# Patient Record
Sex: Female | Born: 1975 | Race: Black or African American | Hispanic: No | Marital: Married | Smoking: Never smoker
Health system: Southern US, Community
[De-identification: ages and names within clinical notes are randomized; demographics above are authoritative.]

## PROBLEM LIST (undated history)

## (undated) DIAGNOSIS — M1990 Unspecified osteoarthritis, unspecified site: Secondary | ICD-10-CM

## (undated) DIAGNOSIS — R519 Headache, unspecified: Secondary | ICD-10-CM

## (undated) DIAGNOSIS — M7120 Synovial cyst of popliteal space [Baker], unspecified knee: Secondary | ICD-10-CM

## (undated) DIAGNOSIS — R51 Headache: Secondary | ICD-10-CM

## (undated) DIAGNOSIS — J45909 Unspecified asthma, uncomplicated: Secondary | ICD-10-CM

## (undated) DIAGNOSIS — E669 Obesity, unspecified: Secondary | ICD-10-CM

## (undated) DIAGNOSIS — I10 Essential (primary) hypertension: Secondary | ICD-10-CM

## (undated) DIAGNOSIS — K219 Gastro-esophageal reflux disease without esophagitis: Secondary | ICD-10-CM

## (undated) DIAGNOSIS — T7840XA Allergy, unspecified, initial encounter: Secondary | ICD-10-CM

## (undated) HISTORY — DX: Gastro-esophageal reflux disease without esophagitis: K21.9

## (undated) HISTORY — DX: Headache, unspecified: R51.9

## (undated) HISTORY — DX: Synovial cyst of popliteal space (Baker), unspecified knee: M71.20

## (undated) HISTORY — DX: Headache: R51

## (undated) HISTORY — DX: Essential (primary) hypertension: I10

## (undated) HISTORY — DX: Unspecified osteoarthritis, unspecified site: M19.90

## (undated) HISTORY — DX: Obesity, unspecified: E66.9

## (undated) HISTORY — PX: TONSILLECTOMY: SUR1361

---

## 1998-09-12 ENCOUNTER — Inpatient Hospital Stay (HOSPITAL_COMMUNITY): Admission: AD | Admit: 1998-09-12 | Discharge: 1998-09-15 | Payer: Self-pay | Admitting: Obstetrics and Gynecology

## 1998-09-12 ENCOUNTER — Encounter: Payer: Self-pay | Admitting: Obstetrics and Gynecology

## 1998-09-12 ENCOUNTER — Encounter (INDEPENDENT_AMBULATORY_CARE_PROVIDER_SITE_OTHER): Payer: Self-pay

## 1998-09-16 ENCOUNTER — Encounter (HOSPITAL_COMMUNITY): Admission: RE | Admit: 1998-09-16 | Discharge: 1998-12-15 | Payer: Self-pay | Admitting: Obstetrics and Gynecology

## 1998-10-18 ENCOUNTER — Other Ambulatory Visit: Admission: RE | Admit: 1998-10-18 | Discharge: 1998-10-18 | Payer: Self-pay | Admitting: *Deleted

## 1998-12-17 ENCOUNTER — Encounter (HOSPITAL_COMMUNITY): Admission: RE | Admit: 1998-12-17 | Discharge: 1999-03-17 | Payer: Self-pay | Admitting: Obstetrics and Gynecology

## 1999-11-05 ENCOUNTER — Other Ambulatory Visit: Admission: RE | Admit: 1999-11-05 | Discharge: 1999-11-05 | Payer: Self-pay | Admitting: *Deleted

## 2000-09-29 ENCOUNTER — Encounter: Payer: Self-pay | Admitting: Obstetrics and Gynecology

## 2000-09-29 ENCOUNTER — Observation Stay (HOSPITAL_COMMUNITY): Admission: AD | Admit: 2000-09-29 | Discharge: 2000-09-30 | Payer: Self-pay | Admitting: Obstetrics and Gynecology

## 2000-09-30 ENCOUNTER — Encounter: Payer: Self-pay | Admitting: Obstetrics and Gynecology

## 2000-10-19 ENCOUNTER — Inpatient Hospital Stay (HOSPITAL_COMMUNITY): Admission: AD | Admit: 2000-10-19 | Discharge: 2000-10-22 | Payer: Self-pay | Admitting: Obstetrics and Gynecology

## 2000-10-23 ENCOUNTER — Encounter: Admission: RE | Admit: 2000-10-23 | Discharge: 2000-11-22 | Payer: Self-pay | Admitting: Obstetrics and Gynecology

## 2000-11-17 ENCOUNTER — Other Ambulatory Visit: Admission: RE | Admit: 2000-11-17 | Discharge: 2000-11-17 | Payer: Self-pay | Admitting: Obstetrics and Gynecology

## 2000-11-23 ENCOUNTER — Encounter: Admission: RE | Admit: 2000-11-23 | Discharge: 2000-12-23 | Payer: Self-pay | Admitting: Obstetrics and Gynecology

## 2001-01-23 ENCOUNTER — Encounter: Admission: RE | Admit: 2001-01-23 | Discharge: 2001-02-22 | Payer: Self-pay | Admitting: Obstetrics and Gynecology

## 2002-04-13 ENCOUNTER — Other Ambulatory Visit: Admission: RE | Admit: 2002-04-13 | Discharge: 2002-04-13 | Payer: Self-pay | Admitting: Obstetrics and Gynecology

## 2002-08-03 ENCOUNTER — Encounter: Payer: Self-pay | Admitting: Internal Medicine

## 2002-08-03 ENCOUNTER — Emergency Department (HOSPITAL_COMMUNITY): Admission: EM | Admit: 2002-08-03 | Discharge: 2002-08-03 | Payer: Self-pay | Admitting: Internal Medicine

## 2004-04-22 ENCOUNTER — Ambulatory Visit (HOSPITAL_COMMUNITY): Admission: RE | Admit: 2004-04-22 | Discharge: 2004-04-22 | Payer: Self-pay | Admitting: Nurse Practitioner

## 2004-09-30 ENCOUNTER — Emergency Department (HOSPITAL_COMMUNITY): Admission: EM | Admit: 2004-09-30 | Discharge: 2004-09-30 | Payer: Self-pay | Admitting: Emergency Medicine

## 2005-03-19 ENCOUNTER — Inpatient Hospital Stay (HOSPITAL_COMMUNITY): Admission: RE | Admit: 2005-03-19 | Discharge: 2005-03-22 | Payer: Self-pay | Admitting: Obstetrics and Gynecology

## 2005-03-19 ENCOUNTER — Encounter (INDEPENDENT_AMBULATORY_CARE_PROVIDER_SITE_OTHER): Payer: Self-pay | Admitting: *Deleted

## 2006-11-16 ENCOUNTER — Ambulatory Visit (HOSPITAL_COMMUNITY): Admission: RE | Admit: 2006-11-16 | Discharge: 2006-11-16 | Payer: Self-pay | Admitting: Family Medicine

## 2006-12-31 ENCOUNTER — Ambulatory Visit (HOSPITAL_COMMUNITY): Admission: RE | Admit: 2006-12-31 | Discharge: 2006-12-31 | Payer: Self-pay | Admitting: Family Medicine

## 2007-11-14 ENCOUNTER — Other Ambulatory Visit: Admission: RE | Admit: 2007-11-14 | Discharge: 2007-11-14 | Payer: Self-pay | Admitting: Obstetrics & Gynecology

## 2008-09-12 ENCOUNTER — Observation Stay (HOSPITAL_COMMUNITY): Admission: RE | Admit: 2008-09-12 | Discharge: 2008-09-13 | Payer: Self-pay | Admitting: Otolaryngology

## 2008-09-12 ENCOUNTER — Encounter (INDEPENDENT_AMBULATORY_CARE_PROVIDER_SITE_OTHER): Payer: Self-pay | Admitting: Otolaryngology

## 2009-06-10 ENCOUNTER — Ambulatory Visit (HOSPITAL_COMMUNITY): Admission: RE | Admit: 2009-06-10 | Discharge: 2009-06-10 | Payer: Self-pay | Admitting: Family Medicine

## 2009-11-20 ENCOUNTER — Ambulatory Visit (HOSPITAL_COMMUNITY): Admission: RE | Admit: 2009-11-20 | Discharge: 2009-11-20 | Payer: Self-pay | Admitting: Internal Medicine

## 2010-04-08 ENCOUNTER — Other Ambulatory Visit (HOSPITAL_COMMUNITY)
Admission: RE | Admit: 2010-04-08 | Discharge: 2010-04-08 | Disposition: A | Discharge status: Home / Self Care | Payer: 59 | Source: Ambulatory Visit | Attending: Obstetrics & Gynecology | Admitting: Obstetrics & Gynecology

## 2010-04-08 ENCOUNTER — Other Ambulatory Visit: Payer: Self-pay | Admitting: Obstetrics & Gynecology

## 2010-04-08 DIAGNOSIS — Z01419 Encounter for gynecological examination (general) (routine) without abnormal findings: Secondary | ICD-10-CM | POA: Insufficient documentation

## 2010-06-08 LAB — BASIC METABOLIC PANEL
Calcium: 9.6 mg/dL (ref 8.4–10.5)
Glucose, Bld: 92 mg/dL (ref 70–99)
Potassium: 3.4 mEq/L — ABNORMAL LOW (ref 3.5–5.1)

## 2010-06-08 LAB — CBC
HCT: 41.1 % (ref 36.0–46.0)
Hemoglobin: 13.8 g/dL (ref 12.0–15.0)

## 2010-06-20 ENCOUNTER — Other Ambulatory Visit (HOSPITAL_COMMUNITY): Payer: Self-pay | Admitting: Family Medicine

## 2010-06-20 ENCOUNTER — Ambulatory Visit (HOSPITAL_COMMUNITY)
Admission: RE | Admit: 2010-06-20 | Discharge: 2010-06-20 | Disposition: A | Discharge status: Home / Self Care | Payer: 59 | Source: Ambulatory Visit | Attending: Family Medicine | Admitting: Family Medicine

## 2010-06-20 DIAGNOSIS — M7989 Other specified soft tissue disorders: Secondary | ICD-10-CM | POA: Insufficient documentation

## 2010-06-20 DIAGNOSIS — M79609 Pain in unspecified limb: Secondary | ICD-10-CM | POA: Insufficient documentation

## 2010-06-20 DIAGNOSIS — M109 Gout, unspecified: Secondary | ICD-10-CM

## 2010-06-20 DIAGNOSIS — M25549 Pain in joints of unspecified hand: Secondary | ICD-10-CM

## 2010-07-15 NOTE — Op Note (Signed)
[name]         ACCOUNT NO.:  [id number]   MEDICAL RECORD NO.:  [id number]          PATIENT TYPE:  OBV   LOCATION:  2604                         FACILITY:  [facility]   PHYSICIAN:  Zola Button T. Lazarus Salines, M.D. DATE OF BIRTH:  Nov 19, 1975   DATE OF PROCEDURE:  09/12/2008  DATE OF DISCHARGE:                               OPERATIVE REPORT   PREOPERATIVE DIAGNOSES:  1. Hypertrophic inferior turbinates with nasal obstruction.  2. Chronic tonsillitis.  3. Snoring with borderline sleep apnea.   POSTOPERATIVE DIAGNOSES:  1. Hypertrophic inferior turbinates with nasal obstruction.  2. Chronic tonsillitis.  3. Snoring with borderline sleep apnea.   PROCEDURES PERFORMED:  1. Bilateral submucous resection of inferior turbinates.  2. Adult tonsillectomy.  3. Laser-assisted uvulopalatoplasty.   SURGEON:  Gloris Manchester. Wolicki, MD   ANESTHESIA:  General orotracheal.   BLOOD LOSS:  Minimal.   COMPLICATIONS:  None.   FINDINGS:  Relatively straight midline nasal septum.  Bulky soft tissue  inferior turbinates.  2+ deeply imbedded and fibrotic tonsils with  cryptic debris.  A long soft palate and uvula with a narrow side-to-side  diameter of the oropharynx.  No residual adenoids.   PROCEDURE:  With the patient in a comfortable supine position, general  orotracheal anesthesia was induced without difficulty.  At an  appropriate level, the table was turned 90 degrees away from anesthesia,  and the patient was placed in a slight beach chair position.  A saline-  moistened throat pack was placed.  Nasal vibrissae were trimmed.  She  had received preoperative Afrin spray.  At this point, Afrin solution on  1/2 x 3 inch cottonoids was placed along the low septum and turbinates  on each side.  Xylocaine 1% with 1:100,000 epinephrine, 6 mL total was  infiltrated into the inferior turbinates using a 25-gauge spinal needle.  Several minutes were allowed for this to take effect.   The materials  were removed from the nose and observed to be intact and  correct in number.  The findings were as described above.  No septal  work was required.   Beginning on the right side, the anterior hood of the inferior turbinate  was lysed just behind the nasal valve.  The medial mucosa of the  turbinate was incised in an anterior upsloping fashion.  A laterally  based flap was developed.  The turbinate was infractured.  Using angled  turbinate scissors, the turbinate bone and lateral mucosa was resected  in a posterior downsloping fashion taking virtually all the anterior  pole and leaving virtually all the posterior pole.  Additional bony  spicules were dissected and removed to allow more prompt healing.  The  flap was laid back down and the turbinate was outfractured.  The bulbous  posterior pole and the cut mucosal edges were suction coagulated for  hemostasis.  This completed the right inferior turbinate.  The left side  was done in identical fashion.   Hemostasis was observed.  A quadruple thickness bacitracin-impregnated  Telfa pack was placed low in the nose on each side for hemostasis.  This  completed the nasal procedure.   At  this point, the patient was taken out of a sitting position into a  flat position and placed in a full Trendelenburg.  Using a Weider  retractor and Yankauer suction under direct vision, the throat pack was  removed.  There was minimal blood in the pharynx.   Taking care to protect lips, teeth, and endotracheal tube, the Crowe-  Davis mouth gag was introduced, and a stent for visualization.  She had  a deep narrow mandible and several changes of tube were required to  avoid pinching endotracheal tube.  Finally, a #5 flat blade was felt to  configure to her anatomy nicely, and the mouth gag was suspended from  Mayo stand in the standard fashion.  The findings were as described  above.  Xylocaine 1% with 1:100,000 epinephrine, 6 mL total was  infiltrated in  the peritonsillar planes for intraoperative hemostasis.  Several minutes were allowed for this to take effect.   Beginning on the left side, the tonsil was grasped and retracted  medially.  The mucosa overlying the anterior and superior poles was  coagulated and then cut down to the capsule of the tonsil.  Using the  cautery tip as a blunt dissector, lysing fibrous bands, and coagulating  crossing vessels as identified, the tonsil was dissected from its  muscular fossa from inferiorly upward.  It was moderately fibrotic at  the superior pole.  Tonsil was removed in its entirety as determined by  examination of both tonsil and fossa.  A small additional quantity of  cautery rendered the fossa hemostatic.  After completing the left  tonsillectomy, right was performed in identical fashion.   After completing both tonsillectomies and rendering the tonsil fossae  hemostatic, the soft palate was approached.  The tattoo mark placed in  the office at the palatal dimple point was identified.   The laser had been previously tested for aim and function.  The surgical  field was draped out in saline saturated towels.  All personnel in the  room had protective eye gear.   Using the laser at 20-W continuous setting, angled incisions were made  parallel to the edges of the uvula and up into the soft palate at the  level of the tattoo mark.  A small crescent evidence of tissue from the  superior-posterior pillar of the tonsil was resected contiguous with  this incision on both sides.  Finally, the neo-uvula was grasped in  approximately half way down was amputated leaving a chevron on the  anterior surface and a mucosal flap in the posterior surface.  This  mucosal flap was brought forward and secured with interrupted 3-0 Vicryl  sutures, #3 total.  The posterior tonsillar pillar was distracted  laterally and secured to the anterior tonsillar pillar with interrupted  3-0 Vicryl sutures.  This gave an  improved configuration to the  nasopharyngeal oropharyngeal introitus.  Palate retractor and mirror  were used to visualize the nasopharynx, which was also narrow from side-  to-side, but with no significant residual adenoids.   The mouth gag was removed.  The dental status was intact.  The patient  was returned to Anesthesia, awakened, extubated, and transferred to  recovery in stable condition.   COMMENT:  A 36 year old black female with a history of nasal  obstruction, recurrent tonsillitis, snoring, and subthreshold sleep  apnea with indication for the several components of today's procedure.  Anticipate routine  postoperative recovery with attention to ice, elevation, analgesia,  antibiosis, hydration, and observation for  bleeding, emesis, or airway  compromise.  We will observe 23 hours extended recovery and then remove  the packs in the morning and discharge her to her home in the care of  her family.      Gloris Manchester. Lazarus Salines, M.D.  Electronically Signed     KTW/MEDQ  D:  09/12/2008  T:  09/13/2008  Job:  [id number]   cc:   Corrie Mckusick, M.D.

## 2010-07-18 NOTE — Procedures (Signed)
   NAME:  Erin Cannon, Erin Cannon                   ACCOUNT NO.:  [id number]   MEDICAL RECORD NO.:  [id number]                   PATIENT TYPE:  EMS   LOCATION:  ED                                   FACILITY:  [facility]   PHYSICIAN:  Edward L. Juanetta Gosling, M.D.             DATE OF BIRTH:  [birth year]   DATE OF PROCEDURE:  DATE OF DISCHARGE:  08/03/2002                                EKG INTERPRETATION   EKG INTERPRETATION:  The rhythm is sinus rhythm with a tachycardiac rate of  about 115.  There are diffuse nonspecific ST-T wave abnormalities.  Abnormal  electrocardiogram.                                               Oneal Deputy. Juanetta Gosling, M.D.    ELH/MEDQ  D:  08/03/2002  T:  08/03/2002  Job:  [id number]

## 2010-07-18 NOTE — Discharge Summary (Signed)
[facility]  Patient:    Erin Cannon, Erin Cannon Visit Number: [id number] MRN: [id number]          Service Type: OBS Location: 910A 9132 01 Attending Physician:  Lenoard Aden Dictated by:   Lenoard Aden, M.D. Admit Date:  10/19/2000 Discharge Date: 10/22/2000                             Discharge Summary  HOSPITAL COURSE:              The patient underwent uncomplicated repeat C-section for chronic hypertension on October 19, 2000.  Postoperative course uncomplicated.  Hemoglobin 11.4.  Discharged to home day #3.  Prenatal vitamins, iron, Tylox given.  Hypertensive precautions given.  Follow up in the office in four to six weeks. Dictated by:   Lenoard Aden, M.D. Attending Physician:  Lenoard Aden DD:  11/20/00 TD:  11/20/00 Job: [id number]

## 2010-07-18 NOTE — Discharge Summary (Signed)
[name]         ACCOUNT NO.:  [id number]   MEDICAL RECORD NO.:  [id number]          PATIENT TYPE:  INP   LOCATION:  9103                          FACILITY:  [facility]   PHYSICIAN:  Lenoard Aden, M.D.DATE OF BIRTH:  08-09-1975   DATE OF ADMISSION:  03/19/2005  DATE OF DISCHARGE:  03/22/2005                                 DISCHARGE SUMMARY   Patient underwent uncomplicated repeat cesarean section and tubal ligation  on March 19, 2005.  Tolerated regular diet well.  Discharged home day  three.  Prenatal vitamins, Percocet, and Motrin given.  Follow up in the  office four to six weeks.  Discharge teaching done.      Lenoard Aden, M.D.  Electronically Signed     RJT/MEDQ  D:  04/08/2005  T:  04/08/2005  Job:  [id number]

## 2010-07-18 NOTE — Op Note (Signed)
NAMEMable Erin Cannon         ACCOUNT NO.:  [id number]   MEDICAL RECORD NO.:  [id number]          PATIENT TYPE:  INP   LOCATION:  9103                          FACILITY:  [facility]   PHYSICIAN:  Lenoard Aden, M.D.DATE OF BIRTH:  [birth year]   DATE OF PROCEDURE:  03/19/2005  DATE OF DISCHARGE:                                 OPERATIVE REPORT   PREOPERATIVE DIAGNOSES:  1.  Intrauterine pregnancy at 38 weeks.  2.  Chronic hypertension.  3.  Previous cesarean section, for repeat and tubal ligation.   POSTOPERATIVE DIAGNOSES:  1.  Intrauterine pregnancy at 38 weeks.  2.  Chronic hypertension.  3.  Previous cesarean section, for repeat and tubal ligation.   OPERATION/PROCEDURE:  1.  Repeat low segment transverse cesarean section.  2.  Tubal ligation.   SURGEON:  Lenoard Aden, M.D.   ASSISTANT:  Maxie Better, M.D.   ANESTHESIA:  Spinal by Raul Del, M.D.   ESTIMATED BLOOD LOSS:  1000 mL.   FINDINGS:  Full-term living female, occiput transverse position. Apgars 8 and  9.  Placenta delivered posterior position, intact, three-vessel cord noted.  Uterus and tubes within normal limits.  Specimen to pathology.  Bilateral  tubal segments.   DESCRIPTION OF PROCEDURE:  After being apprised of the risks of anesthesia,  infection, bleeding, injury to abdominal organs and need for repair, delayed  versus immediate complications to include bowel and bladder injury, failure  risk of tubal ligation of 5-10 per 1000, she was  brought to the operating  room.  She was administered spinal anesthetic without complications, prepped  and draped in the usual sterile fashion.  Foley catheter placed.  After  achieving adequate anesthesia, the Pfannenstiel skin incision made  with the  scalpel, carried down to the fascia which is nicked in the midline and  opened transversely using Mayo scissors.  Rectus muscles dissected sharply  in the midline.  Peritoneum entered sharply and  bladder blade placed.  Visceral peritoneum scored in a smile-like fashion, dissected sharply off  the lower uterine segment.  Hysterotomy incision made.  Atraumatic delivery  of full-term living female as noted and handed to the pediatricians in  attendance.  Cord blood collected.  Placenta delivered manually.  Intact  three-vessel cord noted.  Uterus exteriorized, curetted using dry lap pack  and closed with one layer of a 0 Monocryl suture.  The left tube traced out  to the fimbriated end.  Ampullary isthmic portion of the tube is grasped and  avascular portion of the mesosalpinx is cauterized, creating a window.  Proximal and distal plain ties are placed. Tubal segment is excised and  tubal lumens are visualized and cauterized.  The same procedure is done on  the right tube and segment is equally removed and sent to pathology.  Irrigation is accomplished.  Bladder flap and incisions are inspected and  found to be hemostatic.  At this time an omental adhesion is dissected  sharply with the electrocautery off the anterior abdominal wall.  Good  hemostasis is noted.  Fascia then closed using the 0 Monocryl in continuous  runningfashion. The subcutaneous tissues  approximated using 0 plain sutures.  Skin closed using staples.  The patient tolerates the procedure well and is  transferred to the recovery room in good condition.      Lenoard Aden, M.D.  Electronically Signed     RJT/MEDQ  D:  03/19/2005  T:  03/19/2005  Job:  [id number]

## 2010-07-18 NOTE — Op Note (Signed)
[facility]  Patient:    ELVERNA, CAFFEE Visit Number: [id number] MRN: [id number]          Service Type: OBS Location: 910A 9132 01 Attending Physician:  Lenoard Aden Proc. Date: 10/19/00 Adm. Date:  10/19/2000                             Operative Report  PREOPERATIVE DIAGNOSES:       1. A 38 week intrauterine pregnancy.                               2. Chronic hypertension.                               3. Previous cesarean section.  POSTOPERATIVE DIAGNOSES:      1. A 38 week intrauterine pregnancy.                               2. Chronic hypertension.                               3. Previous cesarean section.                               4. Pelvic adhesions.  OPERATION:  SURGEON:                      Lenoard Aden, M.D.  ASSISTANT:                    Pershing Cox, M.D.  ANESTHESIA:                   Spinal by Gretta Cool., M.D.  ESTIMATED BLOOD LOSS:         1000 cc.  COMPLICATIONS:                None.  COUNTS:                       Correct.  FINDINGS:                     Full-term living female, 4 pounds and 14 ounces. Apgars 8 and 9.  Placenta manually intact anterior, three vessel cord noted. The patient to recovery in good condition.  Normal tubes and normal ovaries noted.  All counts correct.  DESCRIPTION OF PROCEDURE:     After being apprised of the risks of anesthesia, infection, bleeding, injury to abdominal organs, and need for repair, the patient was brought to the operating room where she was administered spinal anesthetic without complications, prepped and draped in the usual sterile fashion, and catheterized with a Foley catheter.  After achieving adequate anesthesia and a dilute Marcaine solution placed in the area of the old incision, a Pfannenstiel skin incision was made with the scalpel.  After achieving adequate anesthesia, carried down to the fascia which was nicked in the  midline and opened transversely using Mayo scissors.  The rectus muscles are sharply adherent to the anterior bladder flap which was dissected sharply using Mayo scissors.  The rectus muscles  are dissected down sharply in the midline.  The bladder flap is adherent up to the mid body of the uterus.  This was dissected sharply off the lower uterine segment, exposing the previous low segment transverse incision.  A ________ hysterotomy incision had been made and clear fluid noted.  Delivery of a full-term living female from occiput anterior position and handed to pediatricians in attendance.  Apgars 8 and 9. Placenta anterior and delivered manually intact and three vessel cord noted. The uterus exteriorized and normal tubes and ovaries noted.  Uterine curet and using a dry lap pack, attempts to dilate the cervix were unsuccessful using sponge forceps.  At this time, uterine incision is closed using a 0 Monocryl in a continuous running fashion.  A second layer of 0 Monocryl was placed to imbricate the first incision.  Pericolic gutters irrigated and _________ subsequently removed.  The fascia then closed with 0 Vicryl in a continuous running fashion and the skin closed using staples.  The patient tolerated the procedure well and is transferred to the recovery room in good condition. Attending Physician:  Lenoard Aden DD:  10/19/00 TD:  10/20/00 Job: [id number]

## 2010-07-18 NOTE — H&P (Signed)
[facility]  Patient:    Erin Cannon, Erin Cannon                  MRN: [id number] Adm. Date:  [id number] Attending:  Esmeralda Arthur CC:         [facility]   History and Physical  CHIEF COMPLAINT:              Nonreassuring fetal heart rate status.  HISTORY OF PRESENT ILLNESS:   Patient is a 35 year old African-American female, G2, P72, EDD September 2, at 35-2/7ths weeks gestation with a biophysical profile of 2/8 in the office today, repeat biophysical profile 6/10 in [facility].  Patient has a history of borderline gestational hypertension versus chronic hypertension on no medications.  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  PAST MEDICAL HISTORY:         No known drug allergies.  Medications are prenatal vitamins and baby aspirin which was discontinued today.  History of C section at 29 weeks of 1 pound, 12 ounce female in July 2000.  History of migraine headaches.  No other medical or surgical hospitalizations.  PRENATAL LABORATORY DATA:      Blood type O positive, Rh antibody negative, rubella immune, hepatitis B surface and HIV nonreactive.  Pregnancy course uncomplicated except for elevated blood pressures which have not required therapy ranging 120 to 140/80 to 92.  Patient has a history of sickle cell trait.  Father of the baby has tested negative.  PHYSICAL EXAMINATION:  GENERAL:                      She is a well-developed, well-nourished African-American female in no apparent distress.  HEENT:                        Normal.  LUNGS:                        Clear.  HEART:                        Regular rhythm.  ABDOMEN:                      Soft, gravid, and nontender.  PELVIC:                       Cervix closed, 3 cm long, vertex down to the pelvis.  Estimated fetal  weight on ultrasound today 4 pounds and 15 ounces, and normal amniotic fluid index.  Biophysical profile at [facility] 6/10 with normal umbilical artery and MCA Dopplers.  IMPRESSION:                   1. A 35-2/7ths week intrauterine pregnancy.                               2. Chronic hypertension versus gestational                                  hypertension, stable, on no medical therapy.  3. Small for gestational age with estimated                                  fetal weight in the 12th percentile and                                  biophysical profile of 6/10 today.  PLAN:                         Continuous monitoring this evening.  NST is reactive without evidence of decelerations at this time.  Will repeat biophysical profile in the morning, discontinue baby aspirin, discharge home for twice weekly surveillance if biophysical profile 8/10 or better in the morning. DD:  09/29/00 TD:  09/29/00 Job: [id number]

## 2010-07-18 NOTE — H&P (Signed)
[facility]  Patient:    Erin Cannon, Erin Cannon Visit Number: [id number] MRN: [id number]          Service Type: OBS Location: 910A 9132 01 Attending Physician:  Lenoard Aden Adm. Date:  10/19/2000                           History and Physical  CHIEF COMPLAINT:              Elective repeat cesarean section.  HISTORY OF PRESENT ILLNESS:   The patient is a 35 year old, African-American female, G2, P46, EDD November 01, 2000, at 38 weeks for repeat C-section due to chronic hypertension, currently on no medications.  History of severe second trimester preeclampsia with HELLP syndrome and first pregnancy delivered by C-section.  The desires elective repeat.  ALLERGIES:                    No known drug allergies.  MEDICATIONS:                  Prenatal vitamins.  PAST MEDICAL HISTORY:         History of previous C-section, 29 weeks, 1 pound 12 ounce female in July of 2000.  History of migraine headaches.  No other medical or surgical hospitalizations.  SOCIAL HISTORY:               Noncontributory.  FAMILY HISTORY:               Noncontributory.  PRENATAL LABORATORY DATA:     Blood type O positive.  Rh antibody negative. Rubella-immune.  Hepatitis B surface antigen negative.  HIV nonreactive.  GBS negative.  History of sickle cell trait with father of the baby testing negative.  PHYSICAL EXAMINATION:  GENERAL:                      On physical examination, she is an obese, black female, in no apparent distress.  HEENT:                        Normal.  LUNGS:                        Clear.  HEART:                        Regular rate and rhythm.  ABDOMEN:                      Soft, gravida, obese, and nontender.  Estimated fetal weight 6 pounds.  PELVIC:                       Cervical is closed, baby vertex and floating.  EXTREMITIES:                  Reveal no cords.  NEUROLOGICAL:                 Examination is nonfocal.  IMPRESSION:                    1. Chronic hypertension at 38 weeks.                               2. Previous cesarean section for  elective                                  repeat.  PLAN:                         Proceed with elective repeat low-segment transverse cesarean section.  Risks of anesthesia, infection, bleeding, injury to abdominal organs with need for repair is discussed.  The patient acknowledges and desires to proceed. Attending Physician:  Lenoard Aden DD:  10/19/00 TD:  10/19/00 Job: [id number]

## 2011-04-14 ENCOUNTER — Other Ambulatory Visit (HOSPITAL_COMMUNITY)
Admission: RE | Admit: 2011-04-14 | Discharge: 2011-04-14 | Disposition: A | Discharge status: Home / Self Care | Payer: BC Managed Care – PPO | Source: Ambulatory Visit | Attending: Obstetrics & Gynecology | Admitting: Obstetrics & Gynecology

## 2011-04-14 ENCOUNTER — Other Ambulatory Visit: Payer: Self-pay | Admitting: Obstetrics & Gynecology

## 2011-04-14 DIAGNOSIS — Z01419 Encounter for gynecological examination (general) (routine) without abnormal findings: Secondary | ICD-10-CM | POA: Insufficient documentation

## 2012-07-04 ENCOUNTER — Encounter: Payer: Self-pay | Admitting: *Deleted

## 2012-07-04 DIAGNOSIS — I10 Essential (primary) hypertension: Secondary | ICD-10-CM | POA: Insufficient documentation

## 2012-07-04 DIAGNOSIS — K219 Gastro-esophageal reflux disease without esophagitis: Secondary | ICD-10-CM | POA: Insufficient documentation

## 2012-07-05 ENCOUNTER — Other Ambulatory Visit: Payer: Self-pay | Admitting: Obstetrics & Gynecology

## 2012-07-18 ENCOUNTER — Ambulatory Visit (INDEPENDENT_AMBULATORY_CARE_PROVIDER_SITE_OTHER): Payer: BC Managed Care – PPO | Admitting: Obstetrics & Gynecology

## 2012-07-18 ENCOUNTER — Encounter: Payer: Self-pay | Admitting: Obstetrics & Gynecology

## 2012-07-18 VITALS — BP 130/100 | Ht 67.0 in | Wt 291.0 lb

## 2012-07-18 DIAGNOSIS — Z01419 Encounter for gynecological examination (general) (routine) without abnormal findings: Secondary | ICD-10-CM

## 2012-07-18 NOTE — Progress Notes (Signed)
Patient ID: Erin Cannon, female   DOB: [birth year], 37 y.o.   MRN: [id number] Subjective:     Erin Cannon is a 37 y.o. female here for a routine exam.  Patient's last menstrual period was 07/05/2012. G3P3 Current complaints: none.  Personal health questionnaire reviewed: yes.   Gynecologic History Patient's last menstrual period was 07/05/2012. Contraception: tubal ligation Last Pap: 2012. Results were: normal Last mammogram: na. Results were: na  Obstetric History OB History   Grav Para Term Preterm Abortions TAB SAB Ect Mult Living   3 3             # Outc Date GA Lbr Len/2nd Wgt Sex Del Anes PTL Lv   1 PAR 2000   1lb(0.454kg) M CS      2 PAR 2002   5lb(2.268kg) F CS      3 PAR 2007   8lb(3.629kg) M CS          The following portions of the patient's history were reviewed and updated as appropriate: allergies, current medications, past family history, past medical history, past social history, past surgical history and problem list.  Review of Systems  Review of Systems  Constitutional: Negative for fever, chills, weight loss, malaise/fatigue and diaphoresis.  HENT: Negative for hearing loss, ear pain, nosebleeds, congestion, sore throat, neck pain, tinnitus and ear discharge.   Eyes: Negative for blurred vision, double vision, photophobia, pain, discharge and redness.  Respiratory: Negative for cough, hemoptysis, sputum production, shortness of breath, wheezing and stridor.   Cardiovascular: Negative for chest pain, palpitations, orthopnea, claudication, leg swelling and PND.  Gastrointestinal: negative for abdominal pain. Negative for heartburn, nausea, vomiting, diarrhea, constipation, blood in stool and melena.  Genitourinary: Negative for dysuria, urgency, frequency, hematuria and flank pain.  Musculoskeletal: Negative for myalgias, back pain, joint pain and falls.  Skin: Negative for itching and rash.  Neurological: Negative for dizziness, tingling,  tremors, sensory change, speech change, focal weakness, seizures, loss of consciousness, weakness and headaches.  Endo/Heme/Allergies: Negative for environmental allergies and polydipsia. Does not bruise/bleed easily.  Psychiatric/Behavioral: Negative for depression, suicidal ideas, hallucinations, memory loss and substance abuse. The patient is not nervous/anxious and does not have insomnia.        Objective:    Physical Exam  Vitals reviewed. Constitutional: She is oriented to person, place, and time. She appears well-developed and well-nourished.  HENT:  Head: Normocephalic and atraumatic.        Right Ear: External ear normal.  Left Ear: External ear normal.  Nose: Nose normal.  Mouth/Throat: Oropharynx is clear and moist.  Eyes: Conjunctivae and EOM are normal. Pupils are equal, round, and reactive to light. Right eye exhibits no discharge. Left eye exhibits no discharge. No scleral icterus.  Neck: Normal range of motion. Neck supple. No tracheal deviation present. No thyromegaly present.  Cardiovascular: Normal rate, regular rhythm, normal heart sounds and intact distal pulses.  Exam reveals no gallop and no friction rub.   No murmur heard. Respiratory: Effort normal and breath sounds normal. No respiratory distress. She has no wheezes. She has no rales. She exhibits no tenderness.  GI: Soft. Bowel sounds are normal. She exhibits no distension and no mass. There is no tenderness. There is no rebound and no guarding.  Genitourinary:       Vulva is normal without lesions Vagina is pink moist without discharge Cervix normal in appearance and pap is done Uterus is normal size shape and contour Adnexa is negative with  normal sized ovaries   Musculoskeletal: Normal range of motion. She exhibits no edema and no tenderness.  Neurological: She is alert and oriented to person, place, and time. She has normal reflexes. She displays normal reflexes. No cranial nerve deficit. She exhibits  normal muscle tone. Coordination normal.  Skin: Skin is warm and dry. No rash noted. No erythema. No pallor.  Psychiatric: She has a normal mood and affect. Her behavior is normal. Judgment and thought content normal.       Assessment:    Healthy female exam.    Plan:    Contraception: tubal ligation. Follow up in: prn time.   or 1 year

## 2014-01-01 ENCOUNTER — Encounter: Payer: Self-pay | Admitting: Obstetrics & Gynecology

## 2014-07-24 ENCOUNTER — Other Ambulatory Visit (HOSPITAL_COMMUNITY): Payer: Self-pay | Admitting: Internal Medicine

## 2014-07-24 DIAGNOSIS — Z1231 Encounter for screening mammogram for malignant neoplasm of breast: Secondary | ICD-10-CM

## 2014-08-15 ENCOUNTER — Ambulatory Visit (HOSPITAL_COMMUNITY): Payer: Medicaid Other

## 2014-11-22 ENCOUNTER — Telehealth: Payer: Self-pay | Admitting: Neurology

## 2014-11-22 NOTE — Telephone Encounter (Signed)
FYI:  I called the patient and left message that she needs to contact the office that referred her to make any referral changes.

## 2014-11-22 NOTE — Telephone Encounter (Signed)
Patient called said she lives in [place] and if she is a candidate for sleep study can she be referred somewhere closer as opposed to coming back to sleep ctr in office. She also is inquiring if she does have sleep study here, would someone be able to stay with her from her family. Please call and advise at [phone].

## 2014-12-12 ENCOUNTER — Encounter: Payer: Self-pay | Admitting: Neurology

## 2014-12-12 ENCOUNTER — Ambulatory Visit (INDEPENDENT_AMBULATORY_CARE_PROVIDER_SITE_OTHER): Payer: BLUE CROSS/BLUE SHIELD | Admitting: Neurology

## 2014-12-12 VITALS — BP 124/78 | HR 98 | Resp 16 | Ht 67.0 in | Wt 305.0 lb

## 2014-12-12 DIAGNOSIS — G4761 Periodic limb movement disorder: Secondary | ICD-10-CM | POA: Diagnosis not present

## 2014-12-12 DIAGNOSIS — G4719 Other hypersomnia: Secondary | ICD-10-CM | POA: Diagnosis not present

## 2014-12-12 DIAGNOSIS — E669 Obesity, unspecified: Secondary | ICD-10-CM

## 2014-12-12 DIAGNOSIS — R351 Nocturia: Secondary | ICD-10-CM | POA: Diagnosis not present

## 2014-12-12 DIAGNOSIS — G2581 Restless legs syndrome: Secondary | ICD-10-CM

## 2014-12-12 DIAGNOSIS — G4733 Obstructive sleep apnea (adult) (pediatric): Secondary | ICD-10-CM

## 2014-12-12 DIAGNOSIS — R51 Headache: Secondary | ICD-10-CM | POA: Diagnosis not present

## 2014-12-12 DIAGNOSIS — R519 Headache, unspecified: Secondary | ICD-10-CM

## 2014-12-12 NOTE — Progress Notes (Signed)
Subjective:    Patient ID: Erin Cannon is a 39 y.o. female.  HPI     [name], MD, PhD [facility] [address], [address] [address] [place], [place] [address]  Dear Dr. Phillips Odor,  I saw your patient, Erin Cannon, upon your kind request in my neurologic clinic today for initial consultation of her sleep disorder, in particular, concern for underlying obstructive sleep apnea. The patient is accompanied by her 58 yo son today. As you know, Erin Cannon is a very pleasant 39 year old right-handed woman with an underlying medical history of hypertension, reflux disease, knee pain, and severe obesity, who reports snoring and excessive daytime somnolence. I reviewed your office note from 10/29/2014, which you kindly included. She has been on Norco as needed for knee pain and you have referred her to orthopedics. She states that she  is scheduled for an MRI of her left knee. She also is going to see rheumatology as I understand. She reports loud snoring, waking up with a sense of gasping, witnessed pauses in her breathing and waking up with a headache, usually once or twice per week. She has to get up to use the bathroom multiple times in night, about 4-5 times on an average night. She also endorses restless leg symptoms and leg twitching in her sleep as well as leg cramps at times. Symptoms have been ongoing for months. Her weight has been fluctuating but overall she has not gained a lot or lost a lot of weight. She has struggled with her obesity for a long time. She lives with her 3 children, ages 50, 54 and 71. 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 She does not smoke or drink alcohol and drinks caffeine in the form of coffee usually once per day. She had her tonsils removed and uvula trimmed a few years ago under Dr. Lazarus Salines.  She reports no family history of obstructive sleep apnea. Her Epworth sleepiness score is 17 out of 24 today, her  fatigue score is 41 out of 63.  Her Past Medical History Is Significant For: Past Medical History  Diagnosis Date  . Hypertension   . Acid reflux   . Baker's cyst   . Obesity   . Headache   . Osteoarthritis     Her Past Surgical History Is Significant For: Past Surgical History  Procedure Laterality Date  . Cesarean section    . Tonsillectomy      Her Family History Is Significant For: Family History  Problem Relation Age of Onset  . Cancer Mother     breast- age 18  . Heart disease Other   . Hypertension Other   . Heart attack Father     Her Social History Is Significant For: Social History   Social History  . Marital Status: Divorced    Spouse Name: N/A  . Number of Children: 3  . Years of Education: BS   Occupational History  . [employer]     Social History Main Topics  . Smoking status: Never Smoker   . Smokeless tobacco: None  . Alcohol Use: No  . Drug Use: No  . Sexual Activity: No   Other Topics Concern  . None   Social History Narrative   Drinks about 1 cup of coffee or tea a day     Her Allergies Are:  No Known Allergies:   Her Current Medications Are:  Outpatient Encounter Prescriptions as of 12/12/2014  Medication Sig  .  cholecalciferol (VITAMIN D) 1000 UNITS tablet Take 1,000 Units by mouth daily.  Marland Kitchen etodolac (LODINE) 400 MG tablet   . furosemide (LASIX) 20 MG tablet Take 20 mg by mouth.  Marland Kitchen HYDROcodone-acetaminophen (NORCO/VICODIN) 5-325 MG tablet Take 1 tablet by mouth every 6 (six) hours as needed for moderate pain.  Marland Kitchen losartan (COZAAR) 50 MG tablet Take 50 mg by mouth daily.  Marland Kitchen omeprazole (PRILOSEC) 20 MG capsule Take 20 mg by mouth daily.  . [DISCONTINUED] traMADol (ULTRAM) 50 MG tablet Take by mouth every 6 (six) hours as needed.   No facility-administered encounter medications on file as of 12/12/2014.  :  Review of Systems:  Out of a complete 14 point review of systems, all are reviewed and negative with the  exception of these symptoms as listed below:   Review of Systems  Constitutional: Positive for fatigue.  Cardiovascular: Positive for leg swelling.  Endocrine: Positive for polydipsia.       Feeling cold   Musculoskeletal:       Joint pain and swelling, cramps, aching muscles   Neurological: Positive for weakness and numbness.       No trouble falling asleep, trouble staying asleep, snoring, witnessed apnea, wakes up feeling tired in the morning, morning headaches, daytime tiredness, denies taking naps. Restless legs.   Psychiatric/Behavioral:       Anxiety, not enough sleep, decreased energy     Objective:  Neurologic Exam  Physical Exam Physical Examination:   Filed Vitals:   12/12/14 1332  BP: 124/78  Pulse: 98  Resp: 16   General Examination: The patient is a very pleasant 39 y.o. female in no acute distress. She appears well-developed and well-nourished and well groomed.   HEENT: Normocephalic, atraumatic, pupils are equal, round and reactive to light and accommodation. Funduscopic exam is normal with sharp disc margins noted. Extraocular tracking is good without limitation to gaze excursion or nystagmus noted. Normal smooth pursuit is noted. Hearing is grossly intact. Tympanic membranes are clear bilaterally. Face is symmetric with normal facial animation and normal facial sensation. Speech is clear with no dysarthria noted. There is no hypophonia. There is no lip, neck/head, jaw or voice tremor. Neck is supple with full range of passive and active motion. There are no carotid bruits on auscultation. Oropharynx exam reveals: mild mouth dryness, good dental hygiene and moderate airway crowding, due to narrow airway entry, thicker soft palate, and longer tongue. Tonsils are absent with possible residual tonsillar tissue noted and uvula is trimmed. Mallampati is class II. Tongue protrudes centrally and palate elevates symmetrically. Neck circumference is 16-3/8 inches.Nasal inspection  reveals no significant nasal mucosal bogginess or redness and no septal deviation.   Chest: Clear to auscultation without wheezing, rhonchi or crackles noted.  Heart: S1+S2+0, regular and normal without murmurs, rubs or gallops noted.   Abdomen: Soft, non-tender and non-distended with normal bowel sounds appreciated on auscultation.  Extremities: There is trace pitting edema in the distal lower extremities bilaterally. Pedal pulses are intact.  Skin: Warm and dry without trophic changes noted. There are no varicose veins.  Musculoskeletal: exam reveals no obvious joint deformities, tenderness or joint swelling or erythema, with the exception of low back pain reported and left knee pain.   Neurologically:  Mental status: The patient is awake, alert and oriented in all 4 spheres. Her immediate and remote memory, attention, language skills and fund of knowledge are appropriate. There is no evidence of aphasia, agnosia, apraxia or anomia. Speech is clear with normal  prosody and enunciation. Thought process is linear. Mood is normal and affect is normal.  Cranial nerves II - XII are as described above under HEENT exam. In addition: shoulder shrug is normal with equal shoulder height noted. Motor exam: Normal bulk, strength and tone is noted. There is no drift, tremor or rebound. Romberg is negative. Reflexes are 2+ throughout. Babinski: Toes are flexor bilaterally. Fine motor skills and coordination: intact with normal finger taps, normal hand movements, normal rapid alternating patting, normal foot taps and normal foot agility.  Cerebellar testing: No dysmetria or intention tremor on finger to nose testing. Heel to shin is unremarkable bilaterally. There is no truncal or gait ataxia.  Sensory exam: intact to light touch, pinprick, vibration, temperature sense in the upper and lower extremities.  Gait, station and balance: She stands with difficulty. No veering to one side is noted. No leaning to one  side is noted. Posture is age-appropriate and stance is narrow based. Gait shows is mildly slow and cautious. She has trouble with tandem walk as she has trouble putting weight on her left leg.               Assessment and Plan:  In summary, Erin Cannon is a very pleasant 39 y.o.-year old female with an underlying medical history of hypertension, reflux disease, knee pain, and severe obesity, whose history and physical exam are in keeping with obstructive sleep apnea (OSA). I had a long chat with the patient about my findings and the diagnosis of OSA its prognosis and treatment options. We talked about medical treatments, surgical interventions and non-pharmacological approaches. I explained in particular the risks and ramifications of untreated moderate to severe OSA, especially with respect to developing cardiovascular disease down the Road, including congestive heart failure, difficult to treat hypertension, cardiac arrhythmias, or stroke. Even type 2 diabetes has, in part, been linked to untreated OSA. Symptoms of untreated OSA include daytime sleepiness, memory problems, mood irritability and mood disorder such as depression and anxiety, lack of energy, as well as recurrent headaches, especially morning headaches. We talked about trying to maintain a healthy lifestyle in general, as well as the importance of weight control. I encouraged the patient to eat healthy, exercise daily and keep well hydrated, to keep a scheduled bedtime and wake time routine, to not skip any meals and eat healthy snacks in between meals. I advised the patient not to drive when feeling sleepy. I recommended the following at this time: sleep study with potential positive airway pressure titration. (We will score hypopneas at 3% and split the sleep study into diagnostic and treatment portion, if the estimated. 2 hour AHI is >15/h).   I explained the sleep test procedure to the patient and also outlined possible surgical  and non-surgical treatment options of OSA, including the use of a custom-made dental device (which would require a referral to a specialist dentist or oral surgeon), upper airway surgical options, such as pillar implants, radiofrequency surgery, tongue base surgery, and UPPP (which would involve a referral to an ENT surgeon). Rarely, jaw surgery such as mandibular advancement may be considered.  I also explained the CPAP treatment option to the patient, who indicated that she would be willing to try CPAP if the need arises. I explained the importance of being compliant with PAP treatment, not only for insurance purposes but primarily to improve Her symptoms, and for the patient's long term health benefit, including to reduce Her cardiovascular risks. I answered all her questions today  and the patient was in agreement. I would like to see her back after the sleep study is completed and encouraged her to call with any interim questions, concerns, problems or updates.   Thank you very much for allowing me to participate in the care of this nice patient. If I can be of any further assistance to you please do not hesitate to call me at [phone].  Sincerely,   Star Age, MD, PhD

## 2014-12-12 NOTE — Patient Instructions (Signed)

## 2014-12-20 ENCOUNTER — Ambulatory Visit (INDEPENDENT_AMBULATORY_CARE_PROVIDER_SITE_OTHER): Payer: BLUE CROSS/BLUE SHIELD | Admitting: Neurology

## 2014-12-20 DIAGNOSIS — G479 Sleep disorder, unspecified: Secondary | ICD-10-CM

## 2014-12-20 DIAGNOSIS — G4733 Obstructive sleep apnea (adult) (pediatric): Secondary | ICD-10-CM | POA: Diagnosis not present

## 2014-12-20 DIAGNOSIS — G4761 Periodic limb movement disorder: Secondary | ICD-10-CM

## 2014-12-20 DIAGNOSIS — G4710 Hypersomnia, unspecified: Secondary | ICD-10-CM

## 2014-12-20 DIAGNOSIS — G4720 Circadian rhythm sleep disorder, unspecified type: Secondary | ICD-10-CM

## 2014-12-20 DIAGNOSIS — R0683 Snoring: Secondary | ICD-10-CM

## 2014-12-21 NOTE — Sleep Study (Signed)
Please see the scanned sleep study interpretation located in the procedure tab within the chart review section.   

## 2014-12-26 ENCOUNTER — Telehealth: Payer: Self-pay | Admitting: Neurology

## 2014-12-26 NOTE — Telephone Encounter (Signed)
Patient referred by Dr. Phillips OdorGolding, seen by me on 12/12/14, diagnostic PSG on 12/20/14.   Please call and notify the patient that the recent sleep study did not show any significant obstructive sleep apnea. She had very mild leg twitching in sleep, but no significant sleep disorder explaining her sleepiness. She can FU with PCP. I recommend pursuing good sleep hygiene: Keep a regular sleep and wake schedule, try not to exercise or have a meal within 2 hours of your bedtime, try to keep your bedroom conducive for sleep, that is, cool and dark, without light distractors such as an illuminated alarm clock, and refrain from watching TV right before sleep or in the middle of the night and do not keep the TV or radio on during the night. Also, try not to use or play on electronic devices at bedtime, such as your cell phone, tablet PC or laptop. If you like to read at bedtime on an electronic device, try to dim the background light as much as possible. Do not eat in the middle of the night.   I can see her as needed or we can make a FU appointment to go over the details of the study if she desires.   Also, route or fax report to PCP and referring MD, if other than PCP.  Once you have spoken to patient, you can close this encounter.   Thanks,  Huston FoleySaima Gisell Buehrle, MD, PhD [facility]

## 2014-12-26 NOTE — Telephone Encounter (Signed)
I spoke to patient and she is aware of results and recommendation. She will f/u with PCP. I will fax report to him

## 2016-07-03 LAB — BASIC METABOLIC PANEL: GLUCOSE: 90 mg/dL

## 2017-05-24 ENCOUNTER — Encounter (HOSPITAL_COMMUNITY): Payer: Self-pay | Admitting: Emergency Medicine

## 2017-05-24 ENCOUNTER — Emergency Department (HOSPITAL_COMMUNITY)
Admission: EM | Admit: 2017-05-24 | Discharge: 2017-05-24 | Disposition: A | Discharge status: Home / Self Care | Payer: Self-pay | Attending: Emergency Medicine | Admitting: Emergency Medicine

## 2017-05-24 ENCOUNTER — Other Ambulatory Visit: Payer: Self-pay

## 2017-05-24 DIAGNOSIS — Z79899 Other long term (current) drug therapy: Secondary | ICD-10-CM | POA: Insufficient documentation

## 2017-05-24 DIAGNOSIS — N939 Abnormal uterine and vaginal bleeding, unspecified: Secondary | ICD-10-CM

## 2017-05-24 DIAGNOSIS — I10 Essential (primary) hypertension: Secondary | ICD-10-CM | POA: Insufficient documentation

## 2017-05-24 DIAGNOSIS — N938 Other specified abnormal uterine and vaginal bleeding: Secondary | ICD-10-CM | POA: Insufficient documentation

## 2017-05-24 LAB — CBC WITH DIFFERENTIAL/PLATELET
BASOS PCT: 0 %
Basophils Absolute: 0 10*3/uL (ref 0.0–0.1)
EOS ABS: 0.1 10*3/uL (ref 0.0–0.7)
Eosinophils Relative: 2 %
HCT: 38.8 % (ref 36.0–46.0)
Hemoglobin: 12.7 g/dL (ref 12.0–15.0)
Lymphocytes Relative: 36 %
Lymphs Abs: 1.8 10*3/uL (ref 0.7–4.0)
MCH: 27.3 pg (ref 26.0–34.0)
MCHC: 32.7 g/dL (ref 30.0–36.0)
MCV: 83.3 fL (ref 78.0–100.0)
MONO ABS: 0.4 10*3/uL (ref 0.1–1.0)
MONOS PCT: 7 %
Neutro Abs: 2.7 10*3/uL (ref 1.7–7.7)
Neutrophils Relative %: 55 %
PLATELETS: 292 10*3/uL (ref 150–400)
RBC: 4.66 MIL/uL (ref 3.87–5.11)
RDW: 13.7 % (ref 11.5–15.5)
WBC: 5 10*3/uL (ref 4.0–10.5)

## 2017-05-24 LAB — I-STAT BETA HCG BLOOD, ED (MC, WL, AP ONLY)

## 2017-05-24 MED ORDER — MEDROXYPROGESTERONE ACETATE 10 MG PO TABS: ORAL_TABLET | ORAL | 0 refills | Status: DC

## 2017-05-24 NOTE — ED Triage Notes (Signed)
Pt to ER for 3 day hx of heavy menstrual bleeding, reports going through 5 pads/hr, reports dizziness and weakness as well. Pt VSS. Pt is a/o x4.

## 2017-05-24 NOTE — ED Notes (Signed)
Pelvic cart set up at bedside  

## 2017-05-24 NOTE — ED Provider Notes (Signed)
[facility] EMERGENCY [facility]PART[facility] Provider Note   CSN: [id number] Arrival date & time: 05/24/17  0704     History   Chief Complaint Chief Complaint  Patient presents with  . Menstrual Problem  . Vaginal Bleeding    HPI Erin Cannon is a 42 y.o. female.  The history is provided by the patient. No language interpreter was used.  Vaginal Bleeding  Primary symptoms include vaginal bleeding.    Erin Cannon is a 42 y.o. female who presents to the Emergency Department complaining of vaginal bleeding. She reports that four days of heavy vaginally bleeding. Initially bleeding was light to moderate. Over the last 24 to 48 hours she has experienced progressive heavy bleeding. She is using 3 to 4 pads an hour. She is passing diamond a quarter size clots. She also feels that she is a little lightheaded and she has lower abdominal pressure. She denies any fevers, chest pain, shortness of breath. No prior similar symptoms. She is followed [facility], last appointment was two years ago. She does not take any oral contraceptives. No history of DVT or PE. She is a non-smoker. No history of TIA or stroke.  Past Medical History:  Diagnosis Date  . Acid reflux   . Baker's cyst   . Headache   . Hypertension   . Obesity   . Osteoarthritis     Patient Active Problem List   Diagnosis Date Noted  . HTN (hypertension) 07/04/2012  . Acid reflux 07/04/2012    Past Surgical History:  Procedure Laterality Date  . CESAREAN SECTION    . TONSILLECTOMY       OB History    Gravida  3   Para  3   Term      Preterm      AB      Living        SAB      TAB      Ectopic      Multiple      Live Births               Home Medications    Prior to Admission medications   Medication Sig Start Date End Date Taking? Authorizing Provider  cholecalciferol (VITAMIN D) 1000 UNITS tablet Take 1,000 Units by mouth daily.    [provider]  etodolac (LODINE) 400 MG tablet  10/29/14   [provider]  furosemide (LASIX) 20 MG tablet Take 20 mg by mouth.    [provider]  HYDROcodone-acetaminophen (NORCO/VICODIN) 5-325 MG tablet Take 1 tablet by mouth every 6 (six) hours as needed for moderate pain.    [provider]  losartan (COZAAR) 50 MG tablet Take 50 mg by mouth daily.    [provider]  medroxyPROGESTERone (PROVERA) 10 MG tablet Take 1 tablet (10 mg total) by mouth 3 (three) times daily for 3 days, THEN 1 tablet (10 mg total) 2 (two) times daily for 3 days, THEN 1 tablet (10 mg total) daily for 3 days. 05/24/17 06/02/17  Tilden Fossaees, Tyshawn Keel, MD  omeprazole (PRILOSEC) 20 MG capsule Take 20 mg by mouth daily.    [provider]    Family History Family History  Problem Relation Age of Onset  . Cancer Mother        breast- age 44  . Heart attack Father   . Heart disease Other   . Hypertension Other     Social  History Social History   Tobacco Use  . Smoking status: Never Smoker  . Smokeless tobacco: Never Used  Substance Use Topics  . Alcohol use: No    Alcohol/week: 0.0 oz  . Drug use: No     Allergies   Patient has no known allergies.   Review of Systems Review of Systems  Genitourinary: Positive for vaginal bleeding.  All other systems reviewed and are negative.    Physical Exam Updated Vital Signs BP 122/82   Pulse 80   Temp 97.9 F (36.6 C) (Oral)   Ht 5\' 6"  (1.676 m)   Wt 127 kg (280 lb)   LMP 05/21/2017   SpO2 97%   BMI 45.19 kg/m   Physical Exam  Constitutional: She is oriented to person, place, and time. She appears well-developed and well-nourished.  HENT:  Head: Normocephalic and atraumatic.  Cardiovascular: Normal rate and regular rhythm.  No murmur heard. Pulmonary/Chest: Effort normal and breath sounds normal. No respiratory distress.  Abdominal: Soft. There is no tenderness. There is no rebound and no guarding.    Genitourinary:  Genitourinary Comments: Mild to moderate vaginally bleeding. There are no clots in the os. Able to clear all blood from the vaginally vault with three Fox swabs. There was no republishing of blood.  Musculoskeletal: She exhibits no edema or tenderness.  Neurological: She is alert and oriented to person, place, and time.  Skin: Skin is warm and dry.  Psychiatric: She has a normal mood and affect. Her behavior is normal.  Nursing note and vitals reviewed.    ED Treatments / Results  Labs (all labs ordered are listed, but only abnormal results are displayed) Labs Reviewed  CBC WITH DIFFERENTIAL/PLATELET  I-STAT BETA HCG BLOOD, ED ([facility], [facility], [facility] ONLY)    EKG None  Radiology No results found.  Procedures Procedures (including critical care time)  Medications Ordered in ED Medications - No data to display   Initial Impression / Assessment and Plan / ED Course  I have reviewed the triage vital signs and the nursing notes.  Pertinent labs & imaging results that were available during my care of the patient were reviewed by me and considered in my medical decision making (see chart for details).     Patient here for evaluation of menorrhagia feeling lightheaded for the last four days. She does have bleeding on examination at that does not recollect rapidly. She has normal vital signs in the department and normal hemoglobin. No evidence of major hemorrhage. Discussed with on-call OB/GYN, Dr. Chestine Spore with [facility]. Plan to initiate Provera taper with follow-up in the office. Counseled patient on home care for vaginally bleeding. Discussed outpatient follow-up and return precautions.  Final Clinical Impressions(s) / ED Diagnoses   Final diagnoses:  Vaginal bleeding    ED Discharge Orders        Ordered    medroxyPROGESTERone (PROVERA) 10 MG tablet     05/24/17 1015       Tilden Fossa, MD 05/24/17 1023

## 2017-05-31 ENCOUNTER — Encounter (HOSPITAL_COMMUNITY): Payer: Self-pay | Admitting: Emergency Medicine

## 2017-05-31 ENCOUNTER — Emergency Department (HOSPITAL_COMMUNITY)
Admission: EM | Admit: 2017-05-31 | Discharge: 2017-06-01 | Disposition: A | Discharge status: Home / Self Care | Payer: Medicaid Other | Attending: Emergency Medicine | Admitting: Emergency Medicine

## 2017-05-31 DIAGNOSIS — N939 Abnormal uterine and vaginal bleeding, unspecified: Secondary | ICD-10-CM

## 2017-05-31 DIAGNOSIS — I10 Essential (primary) hypertension: Secondary | ICD-10-CM | POA: Insufficient documentation

## 2017-05-31 DIAGNOSIS — Z79899 Other long term (current) drug therapy: Secondary | ICD-10-CM | POA: Insufficient documentation

## 2017-05-31 LAB — CBC
HCT: 36.1 % (ref 36.0–46.0)
Hemoglobin: 11.6 g/dL — ABNORMAL LOW (ref 12.0–15.0)
MCH: 27.1 pg (ref 26.0–34.0)
MCHC: 32.1 g/dL (ref 30.0–36.0)
MCV: 84.3 fL (ref 78.0–100.0)
Platelets: 326 10*3/uL (ref 150–400)
RBC: 4.28 MIL/uL (ref 3.87–5.11)
RDW: 13.8 % (ref 11.5–15.5)
WBC: 6.6 10*3/uL (ref 4.0–10.5)

## 2017-05-31 LAB — BASIC METABOLIC PANEL
Anion gap: 9 (ref 5–15)
BUN: 10 mg/dL (ref 6–20)
CO2: 22 mmol/L (ref 22–32)
Calcium: 8.6 mg/dL — ABNORMAL LOW (ref 8.9–10.3)
Chloride: 109 mmol/L (ref 101–111)
Creatinine, Ser: 1.17 mg/dL — ABNORMAL HIGH (ref 0.44–1.00)
GFR calc Af Amer: >60 mL/min (ref >60)
GFR calc non Af Amer: 56 mL/min — ABNORMAL LOW (ref >60)
GLUCOSE: 106 mg/dL — AB (ref 65–99)
POTASSIUM: 3.8 mmol/L (ref 3.5–5.1)
Sodium: 140 mmol/L (ref 135–145)

## 2017-05-31 LAB — I-STAT BETA HCG BLOOD, ED (MC, WL, AP ONLY): I-stat hCG, quantitative: <5 m[IU]/mL (ref <5)

## 2017-05-31 NOTE — ED Provider Notes (Signed)
[facility] EMERGENCY DEPARTMENT Provider Note   CSN: [id number] Arrival date & time: 05/31/17  2147     History   Chief Complaint Chief Complaint  Patient presents with  . Vaginal Bleeding    HPI Erin Cannon is a 42 y.o. female.  Patient presents to the emergency department with a chief complaint of vaginal bleeding.  She states that she has been bleeding for the past 2 weeks.  She was seen 1 week ago and was prescribed Provera.  She has been taking this.  She states that the bleeding has decreased.  She was using 5 pads per day, and is now using 2 pads per day.  She states that she continues to feel fatigued and lightheaded.  She states that her OB/GYN appointment is not for another 2 weeks.  She wants to know why she is bleeding.  She denies any pain.  She is not anticoagulated.  The history is provided by the patient. No language interpreter was used.    Past Medical History:  Diagnosis Date  . Acid reflux   . Baker's cyst   . Headache   . Hypertension   . Obesity   . Osteoarthritis     Patient Active Problem List   Diagnosis Date Noted  . HTN (hypertension) 07/04/2012  . Acid reflux 07/04/2012    Past Surgical History:  Procedure Laterality Date  . CESAREAN SECTION    . TONSILLECTOMY       OB History    Gravida  3   Para  3   Term      Preterm      AB      Living        SAB      TAB      Ectopic      Multiple      Live Births               Home Medications    Prior to Admission medications   Medication Sig Start Date End Date Taking? Authorizing Provider  cholecalciferol (VITAMIN D) 1000 UNITS tablet Take 1,000 Units by mouth daily.    [provider]  etodolac (LODINE) 400 MG tablet  10/29/14   [provider]  furosemide (LASIX) 20 MG tablet Take 20 mg by mouth.    [provider]  HYDROcodone-acetaminophen (NORCO/VICODIN) 5-325 MG tablet Take 1 tablet by mouth every 6 (six) hours  as needed for moderate pain.    [provider]  losartan (COZAAR) 50 MG tablet Take 50 mg by mouth daily.    [provider]  medroxyPROGESTERone (PROVERA) 10 MG tablet Take 1 tablet (10 mg total) by mouth 3 (three) times daily for 3 days, THEN 1 tablet (10 mg total) 2 (two) times daily for 3 days, THEN 1 tablet (10 mg total) daily for 3 days. 05/24/17 06/02/17  Tilden Fossa, MD  omeprazole (PRILOSEC) 20 MG capsule Take 20 mg by mouth daily.    [provider]    Family History Family History  Problem Relation Age of Onset  . Cancer Mother        breast- age 76  . Heart attack Father   . Heart disease Other   . Hypertension Other     Social History Social History   Tobacco Use  . Smoking status: Never Smoker  . Smokeless tobacco: Never Used  Substance Use Topics  . Alcohol use: No    Alcohol/week: 0.0  oz  . Drug use: No     Allergies   Patient has no known allergies.   Review of Systems Review of Systems  All other systems reviewed and are negative.    Physical Exam Updated Vital Signs BP (!) 159/101 (BP Location: Right Arm)   Pulse 90   Temp 98.2 F (36.8 C) (Oral)   Resp 18   LMP 05/21/2017   SpO2 100%   Physical Exam  Constitutional: She is oriented to person, place, and time. She appears well-developed and well-nourished.  HENT:  Head: Normocephalic and atraumatic.  Eyes: Pupils are equal, round, and reactive to light. Conjunctivae and EOM are normal.  Neck: Normal range of motion. Neck supple.  Cardiovascular: Normal rate and regular rhythm. Exam reveals no gallop and no friction rub.  No murmur heard. Pulmonary/Chest: Effort normal and breath sounds normal. No respiratory distress. She has no wheezes. She has no rales. She exhibits no tenderness.  Abdominal: Soft. Bowel sounds are normal. She exhibits no distension and no mass. There is no tenderness. There is no rebound and no guarding.  Genitourinary:  Genitourinary  Comments: Chaperone present for pelvic exam, there is a very small amount of dark blood in the vaginal vault, the cervix is visualized without any active bleeding, I watched this for several minutes with no bleeding, no tenderness to palpation on bimanual exam  Musculoskeletal: Normal range of motion. She exhibits no edema or tenderness.  Neurological: She is alert and oriented to person, place, and time.  Skin: Skin is warm and dry.  Psychiatric: She has a normal mood and affect. Her behavior is normal. Judgment and thought content normal.  Nursing note and vitals reviewed.    ED Treatments / Results  Labs (all labs ordered are listed, but only abnormal results are displayed) Labs Reviewed  CBC - Abnormal; Notable for the following components:      Result Value   Hemoglobin 11.6 (*)    All other components within normal limits  BASIC METABOLIC PANEL - Abnormal; Notable for the following components:   Glucose, Bld 106 (*)    Creatinine, Ser 1.17 (*)    Calcium 8.6 (*)    GFR calc non Af Amer 56 (*)    All other components within normal limits  I-STAT BETA HCG BLOOD, ED ([facility], [facility], [facility] ONLY)    EKG None  Radiology No results found.  Procedures Procedures (including critical care time)  Medications Ordered in ED Medications - No data to display   Initial Impression / Assessment and Plan / ED Course  I have reviewed the triage vital signs and the nursing notes.  Pertinent labs & imaging results that were available during my care of the patient were reviewed by me and considered in my medical decision making (see chart for details).     Patient with vaginal bleeding.  She has had vaginal bleeding for the past 2 weeks.  The bleeding has been slowing down.  She was initially using 5 pads per day, now she claims to be using 2 pads per day.  Her hemoglobin did drop from 12.7-11.6 in the past week.  However, on pelvic exam today, she has no active bleeding from the cervix, but does  have a small amount of old blood in the vaginal canal.  Her vital signs are stable.  She is not tachycardic nor hypoxic.  She has not passed out.  I believe her to be stable for discharge.  She will finish her  Provera taper tomorrow.  I have encouraged her to take iron supplements.  She will return or follow-up at [facility] if her symptoms return or worsen.  Otherwise, she will follow-up with her OB/GYN in 2 weeks.  Had a long discussion with the patient about this plan, she is understanding and agreeable.  Final Clinical Impressions(s) / ED Diagnoses   Final diagnoses:  Abnormal uterine bleeding (AUB)    ED Discharge Orders        Ordered    ferrous sulfate 325 (65 FE) MG tablet  Daily     06/01/17 0023       Roxy Horseman, PA-C 06/01/17 0034    Dione Booze, MD 06/01/17 [phone]

## 2017-05-31 NOTE — ED Triage Notes (Signed)
Pt reports vaginal bleeding X2 weeks, was seen here 3/25 for same, told to follow up with her OBGYN but they are not able to see her for another 2 weeks. States she is having her change her pad 2X per day.

## 2017-06-01 MED ORDER — FERROUS SULFATE 325 (65 FE) MG PO TABS: 325.0000 mg | ORAL_TABLET | Freq: Every day | ORAL | 0 refills | Status: DC

## 2017-06-01 NOTE — ED Notes (Signed)
Pt discharged from ED; instructions provided and scripts given; Pt encouraged to return to ED if symptoms worsen and to f/u with PCP; Pt verbalized understanding of all instructions 

## 2017-06-01 NOTE — Discharge Instructions (Signed)
Your hemoglobin has dropped by from 12.7 to 11.6 from 1 week ago.  On your exam, there is no active bleeding from the cervix.  Please finish out your provera tomorrow.  You can take iron supplements to help replenish your blood faster.  If the bleeding returns, or if you have passing out spells, or any other concerning symptoms, please return here or go to [facility] for evaluation.

## 2017-12-24 ENCOUNTER — Emergency Department (HOSPITAL_COMMUNITY)
Admission: EM | Admit: 2017-12-24 | Discharge: 2017-12-24 | Disposition: A | Discharge status: Home / Self Care | Payer: BC Managed Care – PPO | Attending: Emergency Medicine | Admitting: Emergency Medicine

## 2017-12-24 ENCOUNTER — Encounter (HOSPITAL_COMMUNITY): Payer: Self-pay | Admitting: Emergency Medicine

## 2017-12-24 ENCOUNTER — Emergency Department (HOSPITAL_COMMUNITY): Payer: BC Managed Care – PPO

## 2017-12-24 DIAGNOSIS — I10 Essential (primary) hypertension: Secondary | ICD-10-CM | POA: Insufficient documentation

## 2017-12-24 DIAGNOSIS — Z79899 Other long term (current) drug therapy: Secondary | ICD-10-CM | POA: Diagnosis not present

## 2017-12-24 DIAGNOSIS — M79602 Pain in left arm: Secondary | ICD-10-CM | POA: Insufficient documentation

## 2017-12-24 MED ORDER — IBUPROFEN 800 MG PO TABS
800.0000 mg | ORAL_TABLET | Freq: Once | ORAL | Status: AC
  Administered 2017-12-24: 800 mg via ORAL
  Filled 2017-12-24: qty 1
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
## 2017-12-24 NOTE — ED Notes (Signed)
Patient transported to X-ray 
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
## 2018-01-05 ENCOUNTER — Ambulatory Visit (HOSPITAL_COMMUNITY)
Admission: RE | Admit: 2018-01-05 | Discharge: 2018-01-05 | Disposition: A | Discharge status: Home / Self Care | Payer: BC Managed Care – PPO | Source: Ambulatory Visit | Attending: Family Medicine | Admitting: Family Medicine

## 2018-01-05 ENCOUNTER — Other Ambulatory Visit (HOSPITAL_COMMUNITY): Payer: Self-pay | Admitting: Family Medicine

## 2018-01-05 DIAGNOSIS — S5012XS Contusion of left forearm, sequela: Secondary | ICD-10-CM

## 2018-01-05 DIAGNOSIS — X58XXXS Exposure to other specified factors, sequela: Secondary | ICD-10-CM | POA: Diagnosis not present

## 2019-05-11 ENCOUNTER — Ambulatory Visit: Payer: Self-pay | Attending: Family

## 2019-05-11 DIAGNOSIS — Z23 Encounter for immunization: Secondary | ICD-10-CM

## 2019-05-11 NOTE — Progress Notes (Signed)
   Covid-19 Vaccination Clinic  Name:  Erin Cannon    MRN: [id number] DOB: 06/13/1975  05/11/2019  [name] was observed post Covid-19 immunization for 15 minutes without incident. She was provided with Vaccine Information Sheet and instruction to access the V-Safe system.   [name] was instructed to call 911 with any severe reactions post vaccine: Marland Kitchen Difficulty breathing  . Swelling of face and throat  . A fast heartbeat  . A bad rash all over body  . Dizziness and weakness   Immunizations Administered    Name Date Dose VIS Date Route   Moderna COVID-19 Vaccine 05/11/2019 12:09 PM 0.5 mL 01/31/2019 Intramuscular   Manufacturer: Moderna   Lot: [id number]   NDC: 94707-615-18

## 2019-06-13 ENCOUNTER — Ambulatory Visit: Payer: BC Managed Care – PPO | Attending: Family

## 2019-06-13 DIAGNOSIS — Z23 Encounter for immunization: Secondary | ICD-10-CM

## 2019-06-13 NOTE — Progress Notes (Signed)
   Covid-19 Vaccination Clinic  Name:  Erin Cannon    MRN: [id number] DOB: 02/21/1976  06/13/2019  Erin Cannon was observed post Covid-19 immunization for 15 minutes without incident. She was provided with Vaccine Information Sheet and instruction to access the V-Safe system.   Erin Cannon was instructed to call 911 with any severe reactions post vaccine: Marland Kitchen Difficulty breathing  . Swelling of face and throat  . A fast heartbeat  . A bad rash all over body  . Dizziness and weakness   Immunizations Administered    Name Date Dose VIS Date Route   Moderna COVID-19 Vaccine 06/13/2019 12:26 PM 0.5 mL 01/31/2019 Intramuscular   Manufacturer: Moderna   Lot: [id number]   NDC: 30097-949-97

## 2020-03-19 ENCOUNTER — Other Ambulatory Visit: Payer: BC Managed Care – PPO

## 2020-05-27 ENCOUNTER — Other Ambulatory Visit: Payer: Self-pay | Admitting: Surgery

## 2020-07-02 ENCOUNTER — Other Ambulatory Visit: Payer: Self-pay

## 2020-07-02 ENCOUNTER — Encounter (HOSPITAL_BASED_OUTPATIENT_CLINIC_OR_DEPARTMENT_OTHER): Payer: Self-pay | Admitting: Surgery

## 2020-07-05 ENCOUNTER — Other Ambulatory Visit (HOSPITAL_COMMUNITY)
Admission: RE | Admit: 2020-07-05 | Discharge: 2020-07-05 | Disposition: A | Discharge status: Home / Self Care | Payer: BC Managed Care – PPO | Source: Ambulatory Visit | Attending: Surgery | Admitting: Surgery

## 2020-07-05 DIAGNOSIS — Z01812 Encounter for preprocedural laboratory examination: Secondary | ICD-10-CM | POA: Insufficient documentation

## 2020-07-05 DIAGNOSIS — Z20822 Contact with and (suspected) exposure to covid-19: Secondary | ICD-10-CM | POA: Diagnosis not present

## 2020-07-05 NOTE — Progress Notes (Signed)
Anesthesia consult completed. Will proceed with surgery on 5/10. Surgical soap and instructions given, patient verbalized understanding.

## 2020-07-06 LAB — SARS CORONAVIRUS 2 (TAT 6-24 HRS): SARS Coronavirus 2: NEGATIVE

## 2020-07-08 MED ORDER — CEFAZOLIN IN SODIUM CHLORIDE 3-0.9 GM/100ML-% IV SOLN
3.0000 g | INTRAVENOUS | Status: AC
  Administered 2020-07-09: 3 g via INTRAVENOUS
  Filled 2020-07-08: qty 100

## 2020-07-08 NOTE — H&P (Signed)
Erin Cannon  Location: [facility] Patient #: [id number] DOB: 1975/09/01 Married / Language: English / Race: Black or African American Female   History of Present Illness  The patient is a 45 year old female who presents with a complaint of Mass.  Left axillary mass  This is a pleasant 45 year old female referred here for evaluation of a small mass in her left axilla. She reports it is been in for some time and will intermittently become significantly inflamed and tender and then regressed. She cannot relate this to her monthly cycle. It is never drained. She has never had hidradenitis. Her most recent mammograms were unremarkable. She does have a family history of breast cancer in her mother. She denies nipple discharge. She denies fevers or chills. She is otherwise without complaints.   Past Surgical History Cesarean Section - Multiple  Oral Surgery   Diagnostic Studies History Colonoscopy  never Mammogram  within last year Pap Smear  1-5 years ago  Allergies No Known Allergies   Medication History (Armen Emelda Fear, CMA;  Phentermine HCl (37.5MG  Tablet, Oral) Active. Medications Reconciled  Social History Renee Ramus, CMA;  Alcohol use  Occasional alcohol use. Caffeine use  Carbonated beverages, Coffee. No drug use  Tobacco use  Never smoker.  Family History (Renee Ramus, CMA;  Arthritis  Mother. Breast Cancer  Mother. Heart Disease  Father. Heart disease in female family member before age 47  Hypertension  Mother.  Pregnancy / Birth History Renee Ramus, CMA; Age at menarche  10 years. Gravida  3 Length (months) of breastfeeding  3-6 Maternal age  88-25 Para  3 Regular periods   Other Problems (Armen Emelda Fear, CMA;  Arthritis  Back Pain  High blood pressure     Review of Systems (Armen Emelda Fear CMA; General Not Present- Appetite Loss, Chills, Fatigue, Fever, Night Sweats, Weight Gain and Weight  Loss. Skin Not Present- Change in Wart/Mole, Dryness, Hives, Jaundice, New Lesions, Non-Healing Wounds, Rash and Ulcer. HEENT Not Present- Earache, Hearing Loss, Hoarseness, Nose Bleed, Oral Ulcers, Ringing in the Ears, Seasonal Allergies, Sinus Pain, Sore Throat, Visual Disturbances, Wears glasses/contact lenses and Yellow Eyes. Respiratory Not Present- Bloody sputum, Chronic Cough, Difficulty Breathing, Snoring and Wheezing. Breast Not Present- Breast Mass, Breast Pain, Nipple Discharge and Skin Changes. Cardiovascular Not Present- Chest Pain, Difficulty Breathing Lying Down, Leg Cramps, Palpitations, Rapid Heart Rate, Shortness of Breath and Swelling of Extremities. Gastrointestinal Not Present- Abdominal Pain, Bloating, Bloody Stool, Change in Bowel Habits, Chronic diarrhea, Constipation, Difficulty Swallowing, Excessive gas, Gets full quickly at meals, Hemorrhoids, Indigestion, Nausea, Rectal Pain and Vomiting. Female Genitourinary Not Present- Frequency, Nocturia, Painful Urination, Pelvic Pain and Urgency. Musculoskeletal Not Present- Back Pain, Joint Pain, Joint Stiffness, Muscle Pain, Muscle Weakness and Swelling of Extremities. Neurological Not Present- Decreased Memory, Fainting, Headaches, Numbness, Seizures, Tingling, Tremor, Trouble walking and Weakness. Psychiatric Not Present- Anxiety, Bipolar, Change in Sleep Pattern, Depression, Fearful and Frequent crying. Endocrine Not Present- Cold Intolerance, Excessive Hunger, Hair Changes, Heat Intolerance, Hot flashes and New Diabetes. Hematology Not Present- Blood Thinners, Easy Bruising, Excessive bleeding, Gland problems, HIV and Persistent Infections.  Vitals  Weight: 305.13 lb Height: 67in Body Surface Area: 2.42 m Body Mass Index: 47.79 kg/m  Temp.: 98.2F  Pulse: 78 (Regular)        Physical Exam (Mazie Fencl A. Magnus Ivan MD; 05/27/2020 4:32 PM) The physical exam findings are as follows: Note: She appears well on  exam.  There is a superficial 2 cm hard, firm mass in  the left axilla. It is easily visible. It is tender. There are no skin changes. There is no deep axillary adenopathy. There are no findings consistent with hidradenitis Lungs clear CV RRR Abd soft, NT Skin without rash    Assessment & Plan   AXILLARY MASS, LEFT (R22.32)  Impression: I reviewed her notes in the electronic medical records. She has a left axillary mass of uncertain etiology. This may represent a chronic infected sebaceous cyst but it is quite firm. It feels fairly superficial to be an axillary lymph node. Surgical excision of this mass is recommended for complete histologic evaluation from malignancy given her family history of breast cancer as well as to prevent her ongoing discomfort from the mass. I discussed the procedure with her in detail. I discussed the risk which includes but is not limited to bleeding, infection, injury to surrounding structures, recurrence, the need for further procedures, etc. She understands and wishes to proceed with surgery which will be scheduled.

## 2020-07-09 ENCOUNTER — Encounter (HOSPITAL_BASED_OUTPATIENT_CLINIC_OR_DEPARTMENT_OTHER)
Admission: RE | Disposition: A | Discharge status: Home / Self Care | Payer: Self-pay | Source: Home / Self Care | Attending: Surgery

## 2020-07-09 ENCOUNTER — Ambulatory Visit (HOSPITAL_BASED_OUTPATIENT_CLINIC_OR_DEPARTMENT_OTHER)
Admission: RE | Admit: 2020-07-09 | Discharge: 2020-07-09 | Disposition: A | Discharge status: Home / Self Care | Payer: BC Managed Care – PPO | Attending: Surgery | Admitting: Surgery

## 2020-07-09 ENCOUNTER — Encounter (HOSPITAL_BASED_OUTPATIENT_CLINIC_OR_DEPARTMENT_OTHER): Payer: Self-pay | Admitting: Surgery

## 2020-07-09 ENCOUNTER — Ambulatory Visit (HOSPITAL_BASED_OUTPATIENT_CLINIC_OR_DEPARTMENT_OTHER): Payer: BC Managed Care – PPO | Admitting: Anesthesiology

## 2020-07-09 ENCOUNTER — Other Ambulatory Visit: Payer: Self-pay

## 2020-07-09 DIAGNOSIS — L720 Epidermal cyst: Secondary | ICD-10-CM | POA: Diagnosis not present

## 2020-07-09 DIAGNOSIS — R229 Localized swelling, mass and lump, unspecified: Secondary | ICD-10-CM | POA: Diagnosis present

## 2020-07-09 HISTORY — DX: Allergy, unspecified, initial encounter: T78.40XA

## 2020-07-09 HISTORY — PX: MASS EXCISION: SHX2000

## 2020-07-09 HISTORY — DX: Unspecified asthma, uncomplicated: J45.909

## 2020-07-09 LAB — POCT PREGNANCY, URINE: Preg Test, Ur: NEGATIVE

## 2020-07-09 SURGERY — EXCISION MASS
Anesthesia: General | Site: Axilla | Laterality: Left

## 2020-07-09 MED ORDER — CELECOXIB 200 MG PO CAPS
ORAL_CAPSULE | ORAL | Status: AC
  Filled 2020-07-09: qty 2

## 2020-07-09 MED ORDER — DEXAMETHASONE SODIUM PHOSPHATE 4 MG/ML IJ SOLN
INTRAMUSCULAR | Status: DC | PRN
  Administered 2020-07-09: 10 mg via INTRAVENOUS

## 2020-07-09 MED ORDER — PROPOFOL 10 MG/ML IV BOLUS
INTRAVENOUS | Status: DC | PRN
  Administered 2020-07-09: 200 mg via INTRAVENOUS

## 2020-07-09 MED ORDER — MIDAZOLAM HCL 5 MG/5ML IJ SOLN
INTRAMUSCULAR | Status: DC | PRN
  Administered 2020-07-09: 2 mg via INTRAVENOUS

## 2020-07-09 MED ORDER — TRAMADOL HCL 50 MG PO TABS: 50.0000 mg | ORAL_TABLET | Freq: Four times a day (QID) | ORAL | 0 refills | Status: AC | PRN

## 2020-07-09 MED ORDER — ENSURE PRE-SURGERY PO LIQD: 296.0000 mL | Freq: Once | ORAL | Status: DC

## 2020-07-09 MED ORDER — CEFAZOLIN SODIUM-DEXTROSE 2-4 GM/100ML-% IV SOLN
INTRAVENOUS | Status: AC
  Filled 2020-07-09: qty 100

## 2020-07-09 MED ORDER — MIDAZOLAM HCL 2 MG/2ML IJ SOLN
INTRAMUSCULAR | Status: AC
  Filled 2020-07-09: qty 2

## 2020-07-09 MED ORDER — CHLORHEXIDINE GLUCONATE CLOTH 2 % EX PADS: 6.0000 | MEDICATED_PAD | Freq: Once | CUTANEOUS | Status: DC

## 2020-07-09 MED ORDER — FENTANYL CITRATE (PF) 100 MCG/2ML IJ SOLN
INTRAMUSCULAR | Status: DC | PRN
  Administered 2020-07-09: 50 ug via INTRAVENOUS
  Administered 2020-07-09: 25 ug via INTRAVENOUS

## 2020-07-09 MED ORDER — CELECOXIB 200 MG PO CAPS
400.0000 mg | ORAL_CAPSULE | ORAL | Status: AC
  Administered 2020-07-09: 400 mg via ORAL

## 2020-07-09 MED ORDER — LACTATED RINGERS IV SOLN: INTRAVENOUS | Status: DC

## 2020-07-09 MED ORDER — BUPIVACAINE-EPINEPHRINE 0.5% -1:200000 IJ SOLN
INTRAMUSCULAR | Status: DC | PRN
  Administered 2020-07-09: 12 mL

## 2020-07-09 MED ORDER — PROPOFOL 10 MG/ML IV BOLUS
INTRAVENOUS | Status: AC
  Filled 2020-07-09: qty 20

## 2020-07-09 MED ORDER — ONDANSETRON HCL 4 MG/2ML IJ SOLN
INTRAMUSCULAR | Status: DC | PRN
  Administered 2020-07-09: 4 mg via INTRAVENOUS

## 2020-07-09 MED ORDER — ONDANSETRON HCL 4 MG/2ML IJ SOLN
INTRAMUSCULAR | Status: AC
  Filled 2020-07-09: qty 2

## 2020-07-09 MED ORDER — FENTANYL CITRATE (PF) 100 MCG/2ML IJ SOLN
INTRAMUSCULAR | Status: AC
  Filled 2020-07-09: qty 2

## 2020-07-09 MED ORDER — LIDOCAINE 2% (20 MG/ML) 5 ML SYRINGE
INTRAMUSCULAR | Status: DC | PRN
  Administered 2020-07-09: 60 mg via INTRAVENOUS

## 2020-07-09 MED ORDER — ACETAMINOPHEN 500 MG PO TABS
ORAL_TABLET | ORAL | Status: AC
  Filled 2020-07-09: qty 2

## 2020-07-09 MED ORDER — ACETAMINOPHEN 500 MG PO TABS
1000.0000 mg | ORAL_TABLET | ORAL | Status: AC
  Administered 2020-07-09: 1000 mg via ORAL

## 2020-07-09 SURGICAL SUPPLY — 43 items
ADH SKN CLS APL DERMABOND .7 (GAUZE/BANDAGES/DRESSINGS) ×1
APL PRP STRL LF DISP 70% ISPRP (MISCELLANEOUS) ×1
BLADE CLIPPER SURG (BLADE) IMPLANT
BLADE SURG 15 STRL LF DISP TIS (BLADE) ×1 IMPLANT
BLADE SURG 15 STRL SS (BLADE) ×2
BNDG ELASTIC 3X5.8 VLCR STR LF (GAUZE/BANDAGES/DRESSINGS) IMPLANT
BNDG ELASTIC 4X5.8 VLCR STR LF (GAUZE/BANDAGES/DRESSINGS) IMPLANT
CANISTER SUCT 1200ML W/VALVE (MISCELLANEOUS) IMPLANT
CHLORAPREP W/TINT 26 (MISCELLANEOUS) ×2 IMPLANT
COVER BACK TABLE 60X90IN (DRAPES) ×2 IMPLANT
COVER MAYO STAND STRL (DRAPES) ×2 IMPLANT
COVER WAND RF STERILE (DRAPES) IMPLANT
DECANTER SPIKE VIAL GLASS SM (MISCELLANEOUS) IMPLANT
DERMABOND ADVANCED (GAUZE/BANDAGES/DRESSINGS) ×1
DERMABOND ADVANCED .7 DNX12 (GAUZE/BANDAGES/DRESSINGS) ×1 IMPLANT
DRAPE LAPAROTOMY 100X72 PEDS (DRAPES) ×2 IMPLANT
DRAPE UTILITY XL STRL (DRAPES) ×2 IMPLANT
ELECT REM PT RETURN 9FT ADLT (ELECTROSURGICAL) ×2
ELECTRODE REM PT RTRN 9FT ADLT (ELECTROSURGICAL) ×1 IMPLANT
GLOVE SURG ENC MOIS LTX SZ6.5 (GLOVE) ×1 IMPLANT
GLOVE SURG SIGNA 7.5 PF LTX (GLOVE) ×2 IMPLANT
GLOVE SURG UNDER POLY LF SZ7 (GLOVE) ×1 IMPLANT
GOWN STRL REUS W/ TWL LRG LVL3 (GOWN DISPOSABLE) ×1 IMPLANT
GOWN STRL REUS W/ TWL XL LVL3 (GOWN DISPOSABLE) ×1 IMPLANT
GOWN STRL REUS W/TWL LRG LVL3 (GOWN DISPOSABLE) ×2
GOWN STRL REUS W/TWL XL LVL3 (GOWN DISPOSABLE) ×2
NDL HYPO 25X1 1.5 SAFETY (NEEDLE) ×1 IMPLANT
NEEDLE HYPO 25X1 1.5 SAFETY (NEEDLE) ×2 IMPLANT
NS IRRIG 1000ML POUR BTL (IV SOLUTION) IMPLANT
PACK BASIN DAY SURGERY FS (CUSTOM PROCEDURE TRAY) ×2 IMPLANT
PENCIL SMOKE EVACUATOR (MISCELLANEOUS) ×2 IMPLANT
SLEEVE SCD COMPRESS KNEE MED (STOCKING) ×2 IMPLANT
SPONGE LAP 4X18 RFD (DISPOSABLE) ×2 IMPLANT
SUT MNCRL AB 4-0 PS2 18 (SUTURE) ×2 IMPLANT
SUT VIC AB 2-0 SH 27 (SUTURE)
SUT VIC AB 2-0 SH 27XBRD (SUTURE) IMPLANT
SUT VIC AB 3-0 SH 27 (SUTURE) ×2
SUT VIC AB 3-0 SH 27X BRD (SUTURE) IMPLANT
SYR BULB EAR ULCER 3OZ GRN STR (SYRINGE) ×1 IMPLANT
SYR CONTROL 10ML LL (SYRINGE) ×2 IMPLANT
TOWEL GREEN STERILE FF (TOWEL DISPOSABLE) ×2 IMPLANT
TUBE CONNECTING 20X1/4 (TUBING) IMPLANT
YANKAUER SUCT BULB TIP NO VENT (SUCTIONS) IMPLANT

## 2020-07-09 NOTE — Op Note (Signed)
EXCISION OF LEFT AXILLARY MASS  Procedure Note  Erin Cannon 07/09/2020   Pre-op Diagnosis: LEFT AXILLARY MASS     Post-op Diagnosis: same  Procedure(s): EXCISION OF LEFT AXILLARY MASS (3 cm)  Surgeon(s): Abigail Miyamoto, MD  Anesthesia: General  Staff:  Circulator: Lenn Cal, RN Scrub Person: Wardell Heath, CST  Estimated Blood Loss: Minimal               Specimens: sent to path  Findings: The superficial mass in the axilla was approximately 3 cm and appeared consistent with a chronically infected sebaceous cyst  Procedure: The patient was brought to the operating room and identifies the correct patient.  She was in a supine position on the operating room table and general anesthesia was induced.  Her left axilla was prepped and draped in usual sterile fashion.  I anesthetized the skin around the palpable mass in the left axilla with Marcaine.  I then performed elliptical incision with a scalpel staying around the mass.  I then took this down to the axillary tissue with electrocautery.  The mass appeared consistent with a chronically infected sebaceous cyst.  I excised it in its entirety with the cautery.  The mass was then sent to pathology for evaluation.  I achieved hemostasis with the cautery.  I injected the wound further with Marcaine.  I then closed the subcutaneous tissue with interrupted 3-0 Vicryl sutures and closed the skin with a running 4-0 Monocryl.  Dermabond was then applied.  The patient tolerated the procedure well.  All counts were correct at the end of the procedure.  The patient was then extubated in the operating room and taken in stable condition to the recovery room.          Abigail Miyamoto   Date: 07/09/2020  Time: 11:19 AM

## 2020-07-09 NOTE — Discharge Instructions (Signed)
Ok to shower starting tomorrow  Ice pack, tylenol, and ibuprofen    No vigorous activity for one week.  Need to be out of work until 07/24/2020  Call the office for any notes needed for work  No tylenol/ibuprofen until 3:45  Post Anesthesia Home Care Instructions  Activity: Get plenty of rest for the remainder of the day. A responsible individual must stay with you for 24 hours following the procedure.  For the next 24 hours, DO NOT: -Drive a car -Advertising copywriter -Drink alcoholic beverages -Take any medication unless instructed by your physician -Make any legal decisions or sign important papers.  Meals: Start with liquid foods such as gelatin or soup. Progress to regular foods as tolerated. Avoid greasy, spicy, heavy foods. If nausea and/or vomiting occur, drink only clear liquids until the nausea and/or vomiting subsides. Call your physician if vomiting continues.  Special Instructions/Symptoms: Your throat may feel dry or sore from the anesthesia or the breathing tube placed in your throat during surgery. If this causes discomfort, gargle with warm salt water. The discomfort should disappear within 24 hours.  If you had a scopolamine patch placed behind your ear for the management of post- operative nausea and/or vomiting:  1. The medication in the patch is effective for 72 hours, after which it should be removed.  Wrap patch in a tissue and discard in the trash. Wash hands thoroughly with soap and water. 2. You may remove the patch earlier than 72 hours if you experience unpleasant side effects which may include dry mouth, dizziness or visual disturbances. 3. Avoid touching the patch. Wash your hands with soap and water after contact with the patch.

## 2020-07-09 NOTE — Anesthesia Procedure Notes (Signed)
Procedure Name: LMA Insertion Performed by: Elmer Merwin S, CRNA Pre-anesthesia Checklist: Patient identified, Emergency Drugs available, Suction available and Patient being monitored Patient Re-evaluated:Patient Re-evaluated prior to induction Oxygen Delivery Method: Circle System Utilized Preoxygenation: Pre-oxygenation with 100% oxygen Induction Type: IV induction Ventilation: Mask ventilation without difficulty LMA: LMA inserted LMA Size: 4.0 Number of attempts: 1 Airway Equipment and Method: Bite block Placement Confirmation: positive ETCO2 Tube secured with: Tape Dental Injury: Teeth and Oropharynx as per pre-operative assessment        

## 2020-07-09 NOTE — Interval H&P Note (Signed)
History and Physical Interval Note:no change in H and P  07/09/2020 10:22 AM  Erin Cannon  has presented today for surgery, with the diagnosis of LEFT AXILLARY MASS.  The various methods of treatment have been discussed with the patient and family. After consideration of risks, benefits and other options for treatment, the patient has consented to  Procedure(s): EXCISION OF LEFT AXILLARY MASS (Left) as a surgical intervention.  The patient's history has been reviewed, patient examined, no change in status, stable for surgery.  I have reviewed the patient's chart and labs.  Questions were answered to the patient's satisfaction.     Abigail Miyamoto

## 2020-07-09 NOTE — Transfer of Care (Signed)
Immediate Anesthesia Transfer of Care Note  Patient: Osborn Coho  Procedure(s) Performed: EXCISION OF LEFT AXILLARY MASS (Left Axilla)  Patient Location: PACU  Anesthesia Type:General  Level of Consciousness: drowsy  Airway & Oxygen Therapy: Patient Spontanous Breathing and Patient connected to face mask oxygen  Post-op Assessment: Report given to RN and Post -op Vital signs reviewed and stable  Post vital signs: Reviewed and stable  Last Vitals:  Vitals Value Taken Time  BP    Temp    Pulse 80 07/09/20 1123  Resp 15 07/09/20 1123  SpO2 99 % 07/09/20 1123  Vitals shown include unvalidated device data.  Last Pain:  Vitals:   07/09/20 0930  TempSrc: Oral  PainSc: 0-No pain         Complications: No complications documented.

## 2020-07-09 NOTE — Anesthesia Preprocedure Evaluation (Signed)
Anesthesia Evaluation  Patient identified by MRN, date of birth, ID band Patient awake    Reviewed: Allergy & Precautions, NPO status , Patient's Chart, lab work & pertinent test results  Airway Mallampati: II  TM Distance: >3 FB Neck ROM: Full    Dental no notable dental hx.    Pulmonary asthma ,    Pulmonary exam normal breath sounds clear to auscultation       Cardiovascular hypertension, negative cardio ROS Normal cardiovascular exam Rhythm:Regular Rate:Normal     Neuro/Psych  Headaches, negative psych ROS   GI/Hepatic Neg liver ROS, GERD  ,  Endo/Other  Morbid obesity  Renal/GU negative Renal ROS  negative genitourinary   Musculoskeletal  (+) Arthritis , Osteoarthritis,    Abdominal (+) + obese,   Peds negative pediatric ROS (+)  Hematology negative hematology ROS (+)   Anesthesia Other Findings   Reproductive/Obstetrics negative OB ROS                             Anesthesia Physical Anesthesia Plan  ASA: III  Anesthesia Plan: General   Post-op Pain Management:    Induction: Intravenous  PONV Risk Score and Plan: 3 and Ondansetron, Dexamethasone, Midazolam and Treatment may vary due to age or medical condition  Airway Management Planned: LMA  Additional Equipment:   Intra-op Plan:   Post-operative Plan: Extubation in OR  Informed Consent: I have reviewed the patients History and Physical, chart, labs and discussed the procedure including the risks, benefits and alternatives for the proposed anesthesia with the patient or authorized representative who has indicated his/her understanding and acceptance.     Dental advisory given  Plan Discussed with: CRNA  Anesthesia Plan Comments:         Anesthesia Quick Evaluation

## 2020-07-09 NOTE — Anesthesia Postprocedure Evaluation (Signed)
Anesthesia Post Note  Patient: Erin Cannon  Procedure(s) Performed: EXCISION OF LEFT AXILLARY MASS (Left Axilla)     Patient location during evaluation: PACU Anesthesia Type: General Level of consciousness: awake and alert Pain management: pain level controlled Vital Signs Assessment: post-procedure vital signs reviewed and stable Respiratory status: spontaneous breathing, nonlabored ventilation and respiratory function stable Cardiovascular status: blood pressure returned to baseline and stable Postop Assessment: no apparent nausea or vomiting Anesthetic complications: no   No complications documented.  Last Vitals:  Vitals:   07/09/20 1200 07/09/20 1210  BP: 121/80 120/86  Pulse: 73 62  Resp: 14 16  Temp:  (!) 36.3 C  SpO2: 98% 99%    Last Pain:  Vitals:   07/09/20 1210  TempSrc: Oral  PainSc: 0-No pain                 Lowella Curb

## 2020-07-10 ENCOUNTER — Encounter (HOSPITAL_BASED_OUTPATIENT_CLINIC_OR_DEPARTMENT_OTHER): Payer: Self-pay | Admitting: Surgery

## 2020-07-10 LAB — SURGICAL PATHOLOGY
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
## 2022-05-18 ENCOUNTER — Other Ambulatory Visit (HOSPITAL_COMMUNITY): Payer: Self-pay | Admitting: Internal Medicine

## 2022-05-18 DIAGNOSIS — R7989 Other specified abnormal findings of blood chemistry: Secondary | ICD-10-CM

## 2022-05-18 DIAGNOSIS — M79605 Pain in left leg: Secondary | ICD-10-CM

## 2022-05-19 ENCOUNTER — Other Ambulatory Visit (HOSPITAL_COMMUNITY): Payer: Self-pay | Admitting: Internal Medicine

## 2022-05-19 DIAGNOSIS — R7989 Other specified abnormal findings of blood chemistry: Secondary | ICD-10-CM

## 2022-05-19 DIAGNOSIS — M79605 Pain in left leg: Secondary | ICD-10-CM

## 2022-05-19 DIAGNOSIS — M79604 Pain in right leg: Secondary | ICD-10-CM

## 2022-05-20 ENCOUNTER — Ambulatory Visit (HOSPITAL_COMMUNITY)
Admission: RE | Admit: 2022-05-20 | Discharge: 2022-05-20 | Disposition: A | Discharge status: Home / Self Care | Payer: BC Managed Care – PPO | Source: Ambulatory Visit | Attending: Internal Medicine | Admitting: Internal Medicine

## 2022-05-20 ENCOUNTER — Ambulatory Visit (HOSPITAL_COMMUNITY): Admission: RE | Admit: 2022-05-20 | Payer: BC Managed Care – PPO | Source: Ambulatory Visit

## 2022-05-20 ENCOUNTER — Encounter (HOSPITAL_COMMUNITY): Payer: Self-pay

## 2022-05-20 DIAGNOSIS — R7989 Other specified abnormal findings of blood chemistry: Secondary | ICD-10-CM

## 2022-05-20 DIAGNOSIS — M79604 Pain in right leg: Secondary | ICD-10-CM | POA: Insufficient documentation

## 2022-05-20 DIAGNOSIS — M79605 Pain in left leg: Secondary | ICD-10-CM | POA: Insufficient documentation

## 2022-05-20 NOTE — Progress Notes (Signed)
Bilateral lower extremity venous study completed.   Attempted to call preliminary results to Dr. Nolon Rod office.  Please see CV Procedures for preliminary results.  Charolette Bultman, RVT  9:10 AM 05/20/22

## 2022-08-10 ENCOUNTER — Ambulatory Visit (HOSPITAL_COMMUNITY)
Admission: RE | Admit: 2022-08-10 | Discharge: 2022-08-10 | Disposition: A | Discharge status: Home / Self Care | Payer: BC Managed Care – PPO | Source: Ambulatory Visit | Attending: Family Medicine | Admitting: Family Medicine

## 2022-08-10 ENCOUNTER — Other Ambulatory Visit (HOSPITAL_COMMUNITY): Payer: Self-pay | Admitting: Family Medicine

## 2022-08-10 DIAGNOSIS — S43085A Other dislocation of left shoulder joint, initial encounter: Secondary | ICD-10-CM | POA: Diagnosis present

## 2023-10-07 ENCOUNTER — Other Ambulatory Visit (HOSPITAL_COMMUNITY): Payer: Self-pay | Admitting: Obstetrics & Gynecology

## 2023-10-07 ENCOUNTER — Telehealth: Payer: Self-pay

## 2023-10-07 DIAGNOSIS — D259 Leiomyoma of uterus, unspecified: Secondary | ICD-10-CM

## 2023-10-18 ENCOUNTER — Encounter (HOSPITAL_COMMUNITY): Payer: Self-pay

## 2023-10-18 ENCOUNTER — Ambulatory Visit (HOSPITAL_COMMUNITY): Admission: RE | Admit: 2023-10-18 | Source: Ambulatory Visit
